# Patient Record
Sex: Male | Born: 1980 | Race: Black or African American | Hispanic: No | Marital: Single | Smoking: Never smoker
Health system: Southern US, Community
[De-identification: ages and names within clinical notes are randomized; demographics above are authoritative.]

## PROBLEM LIST (undated history)

## (undated) DIAGNOSIS — E7800 Pure hypercholesterolemia, unspecified: Secondary | ICD-10-CM

## (undated) DIAGNOSIS — K4090 Unilateral inguinal hernia, without obstruction or gangrene, not specified as recurrent: Secondary | ICD-10-CM

## (undated) DIAGNOSIS — T148XXA Other injury of unspecified body region, initial encounter: Secondary | ICD-10-CM

## (undated) HISTORY — PX: MULTIPLE TOOTH EXTRACTIONS: SHX2053

## (undated) HISTORY — DX: Pure hypercholesterolemia, unspecified: E78.00

---

## 2004-11-10 ENCOUNTER — Emergency Department (HOSPITAL_COMMUNITY): Admission: EM | Admit: 2004-11-10 | Discharge: 2004-11-10 | Payer: Self-pay | Admitting: Family Medicine

## 2005-01-25 ENCOUNTER — Emergency Department (HOSPITAL_COMMUNITY): Admission: EM | Admit: 2005-01-25 | Discharge: 2005-01-25 | Payer: Self-pay | Admitting: Family Medicine

## 2005-02-19 ENCOUNTER — Emergency Department (HOSPITAL_COMMUNITY): Admission: EM | Admit: 2005-02-19 | Discharge: 2005-02-19 | Payer: Self-pay | Admitting: Family Medicine

## 2005-09-01 ENCOUNTER — Emergency Department (HOSPITAL_COMMUNITY): Admission: EM | Admit: 2005-09-01 | Discharge: 2005-09-01 | Payer: Self-pay | Admitting: Emergency Medicine

## 2005-09-03 ENCOUNTER — Emergency Department (HOSPITAL_COMMUNITY): Admission: EM | Admit: 2005-09-03 | Discharge: 2005-09-03 | Payer: Self-pay | Admitting: Emergency Medicine

## 2006-04-20 ENCOUNTER — Emergency Department (HOSPITAL_COMMUNITY): Admission: EM | Admit: 2006-04-20 | Discharge: 2006-04-20 | Payer: Self-pay | Admitting: *Deleted

## 2007-07-06 ENCOUNTER — Emergency Department (HOSPITAL_COMMUNITY): Admission: EM | Admit: 2007-07-06 | Discharge: 2007-07-06 | Payer: Self-pay | Admitting: Emergency Medicine

## 2008-02-04 ENCOUNTER — Emergency Department (HOSPITAL_COMMUNITY): Admission: EM | Admit: 2008-02-04 | Discharge: 2008-02-05 | Payer: Self-pay | Admitting: Emergency Medicine

## 2008-09-18 ENCOUNTER — Emergency Department (HOSPITAL_COMMUNITY): Admission: EM | Admit: 2008-09-18 | Discharge: 2008-09-18 | Payer: Self-pay | Admitting: Emergency Medicine

## 2009-11-11 ENCOUNTER — Emergency Department (HOSPITAL_COMMUNITY): Admission: EM | Admit: 2009-11-11 | Discharge: 2009-11-11 | Payer: Self-pay | Admitting: Emergency Medicine

## 2011-09-13 LAB — DIFFERENTIAL
Basophils Absolute: 0
Basophils Relative: 0
Eosinophils Absolute: 0.2
Eosinophils Relative: 5
Lymphocytes Relative: 32
Lymphs Abs: 1.1
Monocytes Absolute: 0.2
Monocytes Relative: 5
Neutro Abs: 2.1
Neutrophils Relative %: 58

## 2011-09-13 LAB — URINALYSIS, ROUTINE W REFLEX MICROSCOPIC
Bilirubin Urine: NEGATIVE
Glucose, UA: NEGATIVE
Hgb urine dipstick: NEGATIVE
Ketones, ur: NEGATIVE
Nitrite: NEGATIVE
Protein, ur: NEGATIVE
Specific Gravity, Urine: 1.013
Urobilinogen, UA: 1
pH: 7.5

## 2011-09-13 LAB — BASIC METABOLIC PANEL
BUN: 9
CO2: 26
Calcium: 8.9
Chloride: 108
Creatinine, Ser: 1.11
GFR calc Af Amer: >60
GFR calc non Af Amer: >60
Glucose, Bld: 92
Potassium: 3.9
Sodium: 136

## 2011-09-13 LAB — CBC
HCT: 41.1
Hemoglobin: 14
MCHC: 34
MCV: 84.2
Platelets: 252
RBC: 4.88
RDW: 12.7
WBC: 3.6 — ABNORMAL LOW

## 2013-04-11 ENCOUNTER — Emergency Department (HOSPITAL_COMMUNITY)
Admission: EM | Admit: 2013-04-11 | Discharge: 2013-04-11 | Disposition: A | Discharge status: Home / Self Care | Payer: PRIVATE HEALTH INSURANCE | Attending: Emergency Medicine | Admitting: Emergency Medicine

## 2013-04-11 ENCOUNTER — Encounter (HOSPITAL_COMMUNITY): Payer: Self-pay | Admitting: Emergency Medicine

## 2013-04-11 DIAGNOSIS — R21 Rash and other nonspecific skin eruption: Secondary | ICD-10-CM | POA: Insufficient documentation

## 2013-04-11 MED ORDER — DIPHENHYDRAMINE HCL 25 MG PO CAPS: 25.0000 mg | ORAL_CAPSULE | Freq: Four times a day (QID) | ORAL | Status: DC | PRN

## 2013-04-11 MED ORDER — PERMETHRIN 5 % EX CREA: TOPICAL_CREAM | CUTANEOUS | Status: DC

## 2013-04-11 NOTE — ED Provider Notes (Signed)
Medical screening examination/treatment/procedure(s) were performed by non-physician practitioner and as supervising physician I was immediately available for consultation/collaboration. Devoria Albe, MD, Armando Gang   Ward Givens, MD 04/11/13 [phone]

## 2013-04-11 NOTE — ED Notes (Signed)
Pt states that he has an itchy rash on his arm since Sunday.  States that he "saw his psych nurse" and she gave him some medicine but it isn't going away.

## 2013-04-11 NOTE — ED Provider Notes (Signed)
History     CSN: [id number]  Arrival date & time 04/11/13  5621   First MD Initiated Contact with Patient 04/11/13 1058      Chief Complaint  Patient presents with  . Rash    (Consider location/radiation/quality/duration/timing/severity/associated sxs/prior treatment) HPI  32 year old male presents for evaluations of itchy rash. Patient reports he slept at a hotel week ago. For the past 3-4 days he has notice itchy rash throughout his body. Rash initially started on his forearm and now spread to his back, legs, and hands. He was getting triamcinolone by a psych nurse, which she has been using without any relief. He denies fever, chills, throat swelling, nausea, vomiting, diarrhea, abdominal pain. No recent medication change, new pets, soap or detergent change. No other family member with the same rash. He has been taking Benadryl for itch which has helped.   History reviewed. No pertinent past medical history.  History reviewed. No pertinent past surgical history.  History reviewed. No pertinent family history.  History  Substance Use Topics  . Smoking status: Never Smoker   . Smokeless tobacco: Not on file  . Alcohol Use: No      Review of Systems  Constitutional: Negative for fever.  Skin: Negative for rash.  Neurological: Negative for headaches.    Allergies  Strawberry  Home Medications   Current Outpatient Rx  Name  Route  Sig  Dispense  Refill  . triamcinolone cream (KENALOG) 0.5 %   Topical   Apply topically 2 (two) times daily as needed.           BP 112/71  Pulse 77  Temp(Src) 98.2 F (36.8 C) (Oral)  Resp 16  SpO2 98%  Physical Exam  Nursing note and vitals reviewed. Constitutional: He appears well-developed and well-nourished. No distress.  HENT:  Mouth/Throat: Oropharynx is clear and moist.  Eyes: Conjunctivae are normal.  Neck: Neck supple.  Cardiovascular: Normal rate and regular rhythm.   Pulmonary/Chest: Effort normal. He has no  wheezes.  Abdominal: Soft. There is no tenderness.  Neurological: He is alert.  Skin: Rash (Multiple small raised bump noted to nec, back, abdomen, R hand, and legs in no specific distribution.  Non petechial/vesicular/pustular lesions.) noted.    ED Course  Procedures (including critical care time)  11:23 AM Pt with rash suggestive of either bed bugs or scabies.  No red flags.  VSS, afebrile.  Will give treatment for scabies, and will continue with benadryl for itch.  Pt recommend to wash all clothing in hot water.    Labs Reviewed - No data to display No results found.   1. Rash       MDM  BP 112/71  Pulse 77  Temp(Src) 98.2 F (36.8 C) (Oral)  Resp 16  SpO2 98%         Fayrene Helper, PA-C 04/11/13 1126

## 2015-06-05 ENCOUNTER — Emergency Department (HOSPITAL_BASED_OUTPATIENT_CLINIC_OR_DEPARTMENT_OTHER)
Admission: EM | Admit: 2015-06-05 | Discharge: 2015-06-06 | Disposition: A | Discharge status: Home / Self Care | Payer: PRIVATE HEALTH INSURANCE | Attending: Emergency Medicine | Admitting: Emergency Medicine

## 2015-06-05 ENCOUNTER — Encounter (HOSPITAL_BASED_OUTPATIENT_CLINIC_OR_DEPARTMENT_OTHER): Payer: Self-pay

## 2015-06-05 DIAGNOSIS — K529 Noninfective gastroenteritis and colitis, unspecified: Secondary | ICD-10-CM | POA: Insufficient documentation

## 2015-06-05 HISTORY — DX: Unilateral inguinal hernia, without obstruction or gangrene, not specified as recurrent: K40.90

## 2015-06-05 NOTE — ED Notes (Signed)
Pt reports 4 days of diarrhea, retching.  Son has been sick with same.  Unknown if febrile.

## 2015-06-06 LAB — URINALYSIS, ROUTINE W REFLEX MICROSCOPIC
Bilirubin Urine: NEGATIVE
Glucose, UA: NEGATIVE mg/dL
Hgb urine dipstick: NEGATIVE
Ketones, ur: NEGATIVE mg/dL
Leukocytes, UA: NEGATIVE
Nitrite: NEGATIVE
PROTEIN: NEGATIVE mg/dL
Specific Gravity, Urine: 1.005 (ref 1.005–1.030)
Urobilinogen, UA: 0.2 mg/dL (ref 0.0–1.0)
pH: 5.5 (ref 5.0–8.0)

## 2015-06-06 MED ORDER — ONDANSETRON 4 MG PO TBDP
4.0000 mg | ORAL_TABLET | Freq: Once | ORAL | Status: AC
  Administered 2015-06-06: 4 mg via ORAL
  Filled 2015-06-06: qty 1

## 2015-06-06 MED ORDER — LOPERAMIDE HCL 2 MG PO CAPS: 2.0000 mg | ORAL_CAPSULE | ORAL | Status: DC | PRN

## 2015-06-06 MED ORDER — ONDANSETRON 4 MG PO TBDP: 4.0000 mg | ORAL_TABLET | Freq: Three times a day (TID) | ORAL | Status: DC | PRN

## 2015-06-06 MED ORDER — LOPERAMIDE HCL 2 MG PO CAPS
4.0000 mg | ORAL_CAPSULE | Freq: Once | ORAL | Status: AC
  Administered 2015-06-06: 4 mg via ORAL
  Filled 2015-06-06: qty 2

## 2015-06-06 NOTE — ED Provider Notes (Signed)
CSN: [id number]     Arrival date & time 06/05/15  2235 History   First MD Initiated Contact with Patient 06/06/15 0002     Chief Complaint  Patient presents with  . Diarrhea     (Consider location/radiation/quality/duration/timing/severity/associated sxs/prior Treatment) HPI  This is a 34 year old male who presents with vomiting and diarrhea 3 days. Patient reports that he went and picked up his son on Monday. Since that time he has had intermittent vomiting and diarrhea. No blood noted in his vomit or diarrhea. He has taken Pepto-Bismol without relief. He states he was unable to go to work on Tuesday because of his symptoms but has since been able to go to work. However, his symptoms recurred earlier this evening. He denies any abdominal pain. His son had similar symptoms.  Past Medical History  Diagnosis Date  . Inguinal hernia    History reviewed. No pertinent past surgical history. No family history on file. History  Substance Use Topics  . Smoking status: Never Smoker   . Smokeless tobacco: Not on file  . Alcohol Use: Yes     Comment: occ    Review of Systems  Constitutional: Negative.  Negative for fever.  Respiratory: Negative.  Negative for chest tightness and shortness of breath.   Cardiovascular: Negative.  Negative for chest pain.  Gastrointestinal: Positive for nausea, vomiting and diarrhea. Negative for abdominal pain.  Genitourinary: Negative.  Negative for dysuria.  Neurological: Negative for headaches.  All other systems reviewed and are negative.     Allergies  Strawberry  Home Medications   Prior to Admission medications   Medication Sig Start Date End Date Taking? Authorizing Provider  diphenhydrAMINE (BENADRYL) 25 mg capsule Take 1 capsule (25 mg total) by mouth every 6 (six) hours as needed for itching. 04/11/13   Fayrene Helper, PA-C  loperamide (IMODIUM) 2 MG capsule Take 1 capsule (2 mg total) by mouth as needed for diarrhea or loose stools. 06/06/15    Shon Baton, MD  ondansetron (ZOFRAN-ODT) 4 MG disintegrating tablet Take 1 tablet (4 mg total) by mouth every 8 (eight) hours as needed for nausea or vomiting. 06/06/15   Shon Baton, MD  permethrin (ELIMITE) 5 % cream Apply from neck down to the rest of your body, leave it for 8 hrs and wash it off.  Do it once, but if rash persist then apply again in 1 week. 04/11/13   Fayrene Helper, PA-C  triamcinolone cream (KENALOG) 0.5 % Apply topically 2 (two) times daily as needed.    Historical Provider, MD   BP 109/73 mmHg  Pulse 77  Temp(Src) 98.3 F (36.8 C) (Oral)  Resp 18  Ht  (1.676 m)  Wt 155 lb (70.308 kg)  BMI 25.03 kg/m2  SpO2 99% Physical Exam  Constitutional: He is oriented to person, place, and time. He appears well-developed and well-nourished. No distress.  HENT:  Head: Normocephalic and atraumatic.  Cardiovascular: Normal rate, regular rhythm and normal heart sounds.   No murmur heard. Pulmonary/Chest: Effort normal and breath sounds normal. No respiratory distress. He has no wheezes.  Abdominal: Soft. Bowel sounds are normal. There is no tenderness. There is no rebound and no guarding.  Musculoskeletal: He exhibits no edema.  Neurological: He is alert and oriented to person, place, and time.  Skin: Skin is warm and dry.  Psychiatric: He has a normal mood and affect.  Nursing note and vitals reviewed.   ED Course  Procedures (including critical care time)  Labs Review Labs Reviewed  URINALYSIS, ROUTINE W REFLEX MICROSCOPIC (NOT [facility])    Imaging Review No results found.   EKG Interpretation None      MDM   Final diagnoses:  Gastroenteritis    Patient presents with vomiting and diarrhea 3 days. Nontoxic on exam. Appears well-hydrated. Mucous membranes are moist. Does not have any abdominal pain and is nontender on exam. Suspect gastroenteritis given the son had similar symptoms. Patient was given Imodium and Zofran. No active emesis while  emergency department. Urinalysis without evidence of ketones. Discuss with patient supportive care including Imodium and Zofran at home. Patient stated understanding.  After history, exam, and medical workup I feel the patient has been appropriately medically screened and is safe for discharge home. Pertinent diagnoses were discussed with the patient. Patient was given return precautions.     Shon Batonourtney F Horton, MD 06/06/15 [phone]

## 2015-06-06 NOTE — Discharge Instructions (Signed)

## 2018-04-23 ENCOUNTER — Encounter (HOSPITAL_COMMUNITY): Payer: Self-pay | Admitting: Emergency Medicine

## 2018-04-23 ENCOUNTER — Other Ambulatory Visit: Payer: Self-pay

## 2018-04-23 ENCOUNTER — Emergency Department (HOSPITAL_COMMUNITY): Payer: Medicaid Other

## 2018-04-23 ENCOUNTER — Emergency Department (HOSPITAL_COMMUNITY)
Admission: EM | Admit: 2018-04-23 | Discharge: 2018-04-23 | Disposition: A | Discharge status: Home / Self Care | Payer: Medicaid Other | Attending: Emergency Medicine | Admitting: Emergency Medicine

## 2018-04-23 DIAGNOSIS — Y929 Unspecified place or not applicable: Secondary | ICD-10-CM | POA: Diagnosis not present

## 2018-04-23 DIAGNOSIS — S82841A Displaced bimalleolar fracture of right lower leg, initial encounter for closed fracture: Secondary | ICD-10-CM | POA: Insufficient documentation

## 2018-04-23 DIAGNOSIS — S99911A Unspecified injury of right ankle, initial encounter: Secondary | ICD-10-CM | POA: Diagnosis present

## 2018-04-23 DIAGNOSIS — W010XXA Fall on same level from slipping, tripping and stumbling without subsequent striking against object, initial encounter: Secondary | ICD-10-CM | POA: Insufficient documentation

## 2018-04-23 DIAGNOSIS — Y9302 Activity, running: Secondary | ICD-10-CM | POA: Diagnosis not present

## 2018-04-23 DIAGNOSIS — Y999 Unspecified external cause status: Secondary | ICD-10-CM | POA: Diagnosis not present

## 2018-04-23 MED ORDER — OXYCODONE-ACETAMINOPHEN 5-325 MG PO TABS
1.0000 | ORAL_TABLET | Freq: Once | ORAL | Status: AC
  Administered 2018-04-23: 1 via ORAL
  Filled 2018-04-23: qty 1

## 2018-04-23 MED ORDER — OXYCODONE-ACETAMINOPHEN 5-325 MG PO TABS: 1.0000 | ORAL_TABLET | ORAL | 0 refills | Status: DC | PRN

## 2018-04-23 NOTE — Consult Note (Signed)
[facility] Consultation  Reason for Consult: Right ankle pain Referring Physician: Dione Booze, MD  Lance Collins is an 37 y.o. male.  HPI: Patient reports running and tripping with inability to bear weight thereafter. Denies other injury. NO tingling or numbness.   Past Medical History:  Diagnosis Date  . Inguinal hernia     History reviewed. No pertinent surgical history.  History reviewed. No pertinent family history.  Social History:  reports that he has never smoked. He has never used smokeless tobacco. He reports that he drinks alcohol. He may smoke materials other than tobacco.  Allergies:  Allergies  Allergen Reactions  . Strawberry Extract Hives    Medications: Prior to Admission:  (Not in a hospital admission)  No results found for this or any previous visit (from the past 48 hour(s)).  Dg Ankle Complete Right  Result Date: 04/23/2018 CLINICAL DATA:  Fall with ankle pain. EXAM: RIGHT ANKLE - COMPLETE 3+ VIEW COMPARISON:  None. FINDINGS: There are comminuted, minimally displaced fractures of the medial and lateral malleolus with severe circumferential soft tissue swelling. IMPRESSION: Comminuted, minimally displaced fractures of the medial and lateral malleoli of the right ankle with severe soft tissue swelling. Electronically Signed   By: Deatra Robinson M.D.   On: 04/23/2018 01:21    ROS No recent fever, bleeding abnormalities, urologic dysfunction, GI problems, or weight gain.  Blood pressure (!) 147/103, pulse 98, temperature 98 F (36.7 C), temperature source Oral, resp. rate 18, height  (1.702 m), weight 70.3 kg (155 lb), SpO2 99 %. Physical Exam  Agitated. Smells of alcohol. NCAT RRR No audible wheezing or visible retractions RLE No traumatic wounds, ecchymosis, or rash  Quite tender medially and laterally  No knee or ankle effusion  Sens DPN, SPN, TN intact  Motor EHL, ext, flex, evers 5/5  DP 2+, Localized swelling at the ankle  joint and fracture  Assessment/Plan: Displaced and comminuted right bimall ankle fracture  Level of swelling precludes early intervention Ice, elevate, Splint and f/u this week for reassessment of soft tissues NWB RLE Percocet  Appreciate Dr. Reynolds Bowl care  Myrene Galas, MD [facility], [facility] [phone]

## 2018-04-23 NOTE — ED Triage Notes (Signed)
Pt reports tripping and landing on right ankle appx ago and swelling noted to lateral right ankle.

## 2018-04-23 NOTE — ED Provider Notes (Signed)
[facility]-EMERGENCY DEPT Provider Note   CSN: [id number] Arrival date & time: 04/23/18  0016     History   Chief Complaint Chief Complaint  Patient presents with  . Ankle Injury    HPI Lance Collins is a 37 y.o. male.  The history is provided by the patient.  He was running and tripped and fell injuring his right ankle.  He is complaining of severe pain in that ankle and is unable to bear weight.  He denies other injury.  Past Medical History:  Diagnosis Date  . Inguinal hernia     There are no active problems to display for this patient.   History reviewed. No pertinent surgical history.      Home Medications    Prior to Admission medications   Medication Sig Start Date End Date Taking? Authorizing Provider  diphenhydrAMINE (BENADRYL) 25 mg capsule Take 1 capsule (25 mg total) by mouth every 6 (six) hours as needed for itching. 04/11/13   Fayrene Helper, PA-C  loperamide (IMODIUM) 2 MG capsule Take 1 capsule (2 mg total) by mouth as needed for diarrhea or loose stools. 06/06/15   Horton, Mayer Masker, MD  ondansetron (ZOFRAN-ODT) 4 MG disintegrating tablet Take 1 tablet (4 mg total) by mouth every 8 (eight) hours as needed for nausea or vomiting. 06/06/15   Horton, Mayer Masker, MD  permethrin (ELIMITE) 5 % cream Apply from neck down to the rest of your body, leave it for 8 hrs and wash it off.  Do it once, but if rash persist then apply again in 1 week. 04/11/13   Fayrene Helper, PA-C  triamcinolone cream (KENALOG) 0.5 % Apply topically 2 (two) times daily as needed.    [provider]    Family History History reviewed. No pertinent family history.  Social History Social History   Tobacco Use  . Smoking status: Never Smoker  . Smokeless tobacco: Never Used  Substance Use Topics  . Alcohol use: Yes    Comment: occ  . Drug use: No     Allergies   Strawberry extract   Review of Systems Review of Systems  All other systems reviewed  and are negative.    Physical Exam Updated Vital Signs BP 118/81 (BP Location: Left Arm)   Pulse (!) 103   Temp 98 F (36.7 C) (Oral)   Resp 18   Ht  (1.702 m)   Wt 70.3 kg (155 lb)   SpO2 97%   BMI 24.28 kg/m   Physical Exam  Nursing note and vitals reviewed.  37 year old male, resting comfortably and in no acute distress. Vital signs are significant for borderline elevated heart rate. Oxygen saturation is 97%, which is normal. Head is normocephalic and atraumatic. PERRLA, EOMI. Oropharynx is clear. Neck is nontender and supple without adenopathy or JVD. Back is nontender and there is no CVA tenderness. Lungs are clear without rales, wheezes, or rhonchi. Chest is nontender. Heart has regular rate and rhythm without murmur. Abdomen is soft, flat, nontender without masses or hepatosplenomegaly and peristalsis is normoactive. Extremities: Swelling of the right ankle with tenderness rather diffusely.  No instability.  Distal neurovascular exam is intact with strong dorsalis pedis pulses, prompt capillary refill, normal sensation. Skin is warm and dry without rash. Neurologic: Mental status is normal, cranial nerves are intact, there are no motor or sensory deficits.  ED Treatments / Results   Radiology Dg Ankle Complete Right  Result Date: 04/23/2018 CLINICAL DATA:  Fall with ankle pain. EXAM: RIGHT ANKLE - COMPLETE 3+ VIEW COMPARISON:  None. FINDINGS: There are comminuted, minimally displaced fractures of the medial and lateral malleolus with severe circumferential soft tissue swelling. IMPRESSION: Comminuted, minimally displaced fractures of the medial and lateral malleoli of the right ankle with severe soft tissue swelling. Electronically Signed   By: Deatra Robinson M.D.   On: 04/23/2018 01:21    Procedures .Splint Application Date/Time: 04/23/2018 3:10 AM Performed by: Dione Booze, MD Authorized by: Dione Booze, MD   Consent:    Consent obtained:  Verbal    Consent given by:  Patient   Risks discussed:  Discoloration, numbness, pain and swelling   Alternatives discussed:  No treatment Pre-procedure details:    Sensation:  Normal   Skin color:  Normal Procedure details:    Laterality:  Right   Location:  Leg   Leg:  L lower leg   Strapping: no     Splint type:  Ankle stirrup   Supplies:  Elastic bandage, Ortho-Glass and cotton padding Post-procedure details:    Pain:  Improved   Sensation:  Normal   Skin color:  Normal   Patient tolerance of procedure:  Tolerated well, no immediate complications Comments:     Splint applied by orthopedic technician, neurovascular status reevaluated by me following splint application.   Medications Ordered in ED Medications  oxyCODONE-acetaminophen (PERCOCET/ROXICET) 5-325 MG per tablet 1 tablet (has no administration in time range)     Initial Impression / Assessment and Plan / ED Course  I have reviewed the triage vital signs and the nursing notes.  Pertinent labs & imaging results that were available during my care of the patient were reviewed by me and considered in my medical decision making (see chart for details).  Bimalleolar fracture of the right ankle.  He is placed in a stirrup splint and will need to follow-up with orthopedics.  Dr. Marcello Fennel, who is on-call for orthopedics, and has reviewed the x-rays and states she will arrange for surgery early next week.  He will be discharged with prescription for oxycodone-acetaminophen, and given crutches.  Final Clinical Impressions(s) / ED Diagnoses   Final diagnoses:  Fall from slip, trip, or stumble, initial encounter  Closed bimalleolar fracture of right ankle, initial encounter    ED Discharge Orders        Ordered    oxyCODONE-acetaminophen (PERCOCET) 5-325 MG tablet  Every 4 hours PRN     04/23/18 0409       Dione Booze, MD 04/23/18 [phone]

## 2018-04-23 NOTE — Discharge Instructions (Signed)
Apply ice several times a day.   Keep your foot elevated as much as possible.   No weight bearing until cleared by the orthopedic doctor.

## 2018-05-10 ENCOUNTER — Encounter (HOSPITAL_COMMUNITY): Payer: Self-pay | Admitting: *Deleted

## 2018-05-10 ENCOUNTER — Other Ambulatory Visit: Payer: Self-pay

## 2018-05-10 NOTE — Progress Notes (Signed)
Pt denies SOB, chest pain, and being under the care of a cardiologist. Pt denies having a stress test, echo and cardiac cath. Pt denies having an EKG and chest x ray within the last year. Pt denies recent labs. Pt made aware to stop taking vitamins, fish oil and herbal medications. Do not take any NSAIDs ie: Ibuprofen, Advil, Naproxen (Aleve), Motrin, BC and Goody Powder. Pt verbalized understanding of all pre-op instructions. 

## 2018-05-11 ENCOUNTER — Ambulatory Visit (HOSPITAL_COMMUNITY): Payer: Medicaid Other | Admitting: Certified Registered Nurse Anesthetist

## 2018-05-11 ENCOUNTER — Encounter (HOSPITAL_COMMUNITY): Payer: Self-pay | Admitting: *Deleted

## 2018-05-11 ENCOUNTER — Ambulatory Visit (HOSPITAL_COMMUNITY): Payer: Medicaid Other

## 2018-05-11 ENCOUNTER — Ambulatory Visit (HOSPITAL_COMMUNITY)
Admission: RE | Admit: 2018-05-11 | Discharge: 2018-05-11 | Disposition: A | Discharge status: Home / Self Care | Payer: Medicaid Other | Source: Ambulatory Visit | Attending: Orthopedic Surgery | Admitting: Orthopedic Surgery

## 2018-05-11 ENCOUNTER — Encounter (HOSPITAL_COMMUNITY)
Admission: RE | Disposition: A | Discharge status: Home / Self Care | Payer: Self-pay | Source: Ambulatory Visit | Attending: Orthopedic Surgery

## 2018-05-11 DIAGNOSIS — W1830XA Fall on same level, unspecified, initial encounter: Secondary | ICD-10-CM | POA: Insufficient documentation

## 2018-05-11 DIAGNOSIS — S93431A Sprain of tibiofibular ligament of right ankle, initial encounter: Secondary | ICD-10-CM | POA: Insufficient documentation

## 2018-05-11 DIAGNOSIS — S82841A Displaced bimalleolar fracture of right lower leg, initial encounter for closed fracture: Secondary | ICD-10-CM | POA: Diagnosis not present

## 2018-05-11 DIAGNOSIS — S82851A Displaced trimalleolar fracture of right lower leg, initial encounter for closed fracture: Secondary | ICD-10-CM | POA: Diagnosis present

## 2018-05-11 DIAGNOSIS — T148XXA Other injury of unspecified body region, initial encounter: Secondary | ICD-10-CM

## 2018-05-11 DIAGNOSIS — Z419 Encounter for procedure for purposes other than remedying health state, unspecified: Secondary | ICD-10-CM

## 2018-05-11 HISTORY — DX: Other injury of unspecified body region, initial encounter: T14.8XXA

## 2018-05-11 HISTORY — PX: ORIF ANKLE FRACTURE: SHX5408

## 2018-05-11 LAB — HEMOGLOBIN: Hemoglobin: 13 g/dL (ref 13.0–17.0)

## 2018-05-11 SURGERY — OPEN REDUCTION INTERNAL FIXATION (ORIF) ANKLE FRACTURE
Anesthesia: Regional | Site: Ankle | Laterality: Right

## 2018-05-11 MED ORDER — LACTATED RINGERS IV SOLN
INTRAVENOUS | Status: DC
  Administered 2018-05-11: 09:00:00 via INTRAVENOUS

## 2018-05-11 MED ORDER — ROPIVACAINE HCL 7.5 MG/ML IJ SOLN
INTRAMUSCULAR | Status: DC | PRN
  Administered 2018-05-11: 20 mL via PERINEURAL

## 2018-05-11 MED ORDER — ONDANSETRON HCL 4 MG/2ML IJ SOLN
INTRAMUSCULAR | Status: DC | PRN
  Administered 2018-05-11 (×2): 4 mg via INTRAVENOUS

## 2018-05-11 MED ORDER — ACETAMINOPHEN 500 MG PO TABS
1000.0000 mg | ORAL_TABLET | Freq: Once | ORAL | Status: AC
  Administered 2018-05-11: 1000 mg via ORAL

## 2018-05-11 MED ORDER — PROPOFOL 10 MG/ML IV BOLUS
INTRAVENOUS | Status: DC | PRN
  Administered 2018-05-11: 30 mg via INTRAVENOUS
  Administered 2018-05-11: 130 mg via INTRAVENOUS

## 2018-05-11 MED ORDER — FENTANYL CITRATE (PF) 250 MCG/5ML IJ SOLN
INTRAMUSCULAR | Status: AC
  Filled 2018-05-11: qty 5

## 2018-05-11 MED ORDER — CEFAZOLIN SODIUM-DEXTROSE 2-4 GM/100ML-% IV SOLN
2.0000 g | INTRAVENOUS | Status: AC
  Administered 2018-05-11: 2 g via INTRAVENOUS

## 2018-05-11 MED ORDER — LIDOCAINE 2% (20 MG/ML) 5 ML SYRINGE
INTRAMUSCULAR | Status: AC
  Filled 2018-05-11: qty 5

## 2018-05-11 MED ORDER — DEXAMETHASONE SODIUM PHOSPHATE 10 MG/ML IJ SOLN
INTRAMUSCULAR | Status: AC
  Filled 2018-05-11: qty 1

## 2018-05-11 MED ORDER — CEFAZOLIN SODIUM-DEXTROSE 2-4 GM/100ML-% IV SOLN
INTRAVENOUS | Status: AC
  Filled 2018-05-11: qty 100

## 2018-05-11 MED ORDER — GABAPENTIN 300 MG PO CAPS
ORAL_CAPSULE | ORAL | Status: AC
  Administered 2018-05-11: 300 mg via ORAL
  Filled 2018-05-11: qty 1

## 2018-05-11 MED ORDER — ONDANSETRON HCL 4 MG/2ML IJ SOLN
INTRAMUSCULAR | Status: AC
  Filled 2018-05-11: qty 2

## 2018-05-11 MED ORDER — MIDAZOLAM HCL 2 MG/2ML IJ SOLN
INTRAMUSCULAR | Status: AC
  Administered 2018-05-11: 2 mg via INTRAVENOUS
  Filled 2018-05-11: qty 2

## 2018-05-11 MED ORDER — BUPIVACAINE-EPINEPHRINE (PF) 0.5% -1:200000 IJ SOLN
INTRAMUSCULAR | Status: DC | PRN
  Administered 2018-05-11: 30 mL via PERINEURAL

## 2018-05-11 MED ORDER — ACETAMINOPHEN 160 MG/5ML PO SOLN: 325.0000 mg | ORAL | Status: DC | PRN

## 2018-05-11 MED ORDER — ROCURONIUM BROMIDE 10 MG/ML (PF) SYRINGE
PREFILLED_SYRINGE | INTRAVENOUS | Status: AC
  Filled 2018-05-11: qty 5

## 2018-05-11 MED ORDER — OXYCODONE-ACETAMINOPHEN 5-325 MG PO TABS: 1.0000 | ORAL_TABLET | Freq: Four times a day (QID) | ORAL | 0 refills | Status: DC | PRN

## 2018-05-11 MED ORDER — CHLORHEXIDINE GLUCONATE 4 % EX LIQD: 60.0000 mL | Freq: Once | CUTANEOUS | Status: DC

## 2018-05-11 MED ORDER — FENTANYL CITRATE (PF) 100 MCG/2ML IJ SOLN
INTRAMUSCULAR | Status: AC
  Administered 2018-05-11: 100 ug via INTRAVENOUS
  Filled 2018-05-11: qty 2

## 2018-05-11 MED ORDER — BUPIVACAINE HCL (PF) 0.5 % IJ SOLN: INTRAMUSCULAR | Status: DC | PRN

## 2018-05-11 MED ORDER — OXYCODONE HCL 5 MG PO TABS
5.0000 mg | ORAL_TABLET | Freq: Four times a day (QID) | ORAL | 0 refills | Status: DC | PRN
Start: 2018-05-11 — End: 2020-09-26

## 2018-05-11 MED ORDER — 0.9 % SODIUM CHLORIDE (POUR BTL) OPTIME
TOPICAL | Status: DC | PRN
  Administered 2018-05-11: 1000 mL

## 2018-05-11 MED ORDER — FENTANYL CITRATE (PF) 100 MCG/2ML IJ SOLN
100.0000 ug | Freq: Once | INTRAMUSCULAR | Status: AC
  Administered 2018-05-11: 100 ug via INTRAVENOUS

## 2018-05-11 MED ORDER — DEXAMETHASONE SODIUM PHOSPHATE 10 MG/ML IJ SOLN
INTRAMUSCULAR | Status: DC | PRN
  Administered 2018-05-11: 10 mg via INTRAVENOUS

## 2018-05-11 MED ORDER — GABAPENTIN 300 MG PO CAPS
300.0000 mg | ORAL_CAPSULE | Freq: Once | ORAL | Status: AC
  Administered 2018-05-11: 300 mg via ORAL

## 2018-05-11 MED ORDER — OXYCODONE HCL 5 MG/5ML PO SOLN: 5.0000 mg | Freq: Once | ORAL | Status: DC | PRN

## 2018-05-11 MED ORDER — FENTANYL CITRATE (PF) 100 MCG/2ML IJ SOLN: 25.0000 ug | INTRAMUSCULAR | Status: DC | PRN

## 2018-05-11 MED ORDER — OXYCODONE HCL 5 MG PO TABS: 5.0000 mg | ORAL_TABLET | Freq: Once | ORAL | Status: DC | PRN

## 2018-05-11 MED ORDER — MIDAZOLAM HCL 2 MG/2ML IJ SOLN
2.0000 mg | Freq: Once | INTRAMUSCULAR | Status: AC
  Administered 2018-05-11: 2 mg via INTRAVENOUS

## 2018-05-11 MED ORDER — ASPIRIN EC 325 MG PO TBEC: 325.0000 mg | DELAYED_RELEASE_TABLET | Freq: Every day | ORAL | 0 refills | Status: DC

## 2018-05-11 MED ORDER — ACETAMINOPHEN 500 MG PO TABS: 500.0000 mg | ORAL_TABLET | Freq: Two times a day (BID) | ORAL | 0 refills | Status: DC

## 2018-05-11 MED ORDER — LIDOCAINE 2% (20 MG/ML) 5 ML SYRINGE
INTRAMUSCULAR | Status: DC | PRN
  Administered 2018-05-11: 60 mg via INTRAVENOUS

## 2018-05-11 MED ORDER — ACETAMINOPHEN 500 MG PO TABS
ORAL_TABLET | ORAL | Status: AC
  Administered 2018-05-11: 1000 mg via ORAL
  Filled 2018-05-11: qty 2

## 2018-05-11 MED ORDER — PROPOFOL 10 MG/ML IV BOLUS
INTRAVENOUS | Status: AC
  Filled 2018-05-11: qty 20

## 2018-05-11 MED ORDER — ACETAMINOPHEN 325 MG PO TABS: 325.0000 mg | ORAL_TABLET | ORAL | Status: DC | PRN

## 2018-05-11 MED ORDER — BUPIVACAINE LIPOSOME 1.3 % IJ SUSP
INTRAMUSCULAR | Status: DC | PRN
Start: 2018-05-11 — End: 2018-05-11

## 2018-05-11 SURGICAL SUPPLY — 73 items
BANDAGE ACE 4X5 VEL STRL LF (GAUZE/BANDAGES/DRESSINGS) ×3 IMPLANT
BANDAGE ACE 6X5 VEL STRL LF (GAUZE/BANDAGES/DRESSINGS) ×3 IMPLANT
BANDAGE ESMARK 6X9 LF (GAUZE/BANDAGES/DRESSINGS) ×1 IMPLANT
BIT DRILL 2.4X140 LONG SOLID (BIT) ×3 IMPLANT
BIT DRILL SOLID 2.0 X 110MM (DRILL) ×1 IMPLANT
BNDG ESMARK 6X9 LF (GAUZE/BANDAGES/DRESSINGS) ×3
BNDG GAUZE ELAST 4 BULKY (GAUZE/BANDAGES/DRESSINGS) ×3 IMPLANT
BRUSH SCRUB SURG 4.25 DISP (MISCELLANEOUS) ×6 IMPLANT
COVER MAYO STAND STRL (DRAPES) ×3 IMPLANT
COVER SURGICAL LIGHT HANDLE (MISCELLANEOUS) ×3 IMPLANT
DEVICE FIXATION SYNDESMOSIS (Bone Implant) ×3 IMPLANT
DRAPE C-ARM 42X72 X-RAY (DRAPES) IMPLANT
DRAPE C-ARMOR (DRAPES) ×6 IMPLANT
DRAPE HALF SHEET 40X57 (DRAPES) ×3 IMPLANT
DRAPE U-SHAPE 47X51 STRL (DRAPES) ×3 IMPLANT
DRILL SOLID 2.0 X 110MM (DRILL) ×3
DRSG ADAPTIC 3X8 NADH LF (GAUZE/BANDAGES/DRESSINGS) ×3 IMPLANT
DRSG EMULSION OIL 3X3 NADH (GAUZE/BANDAGES/DRESSINGS) IMPLANT
ELECT REM PT RETURN 9FT ADLT (ELECTROSURGICAL) ×3
ELECTRODE REM PT RTRN 9FT ADLT (ELECTROSURGICAL) ×1 IMPLANT
FIXATION ZIPTIGHT ANKLE SNDSMS (Ankle) ×1 IMPLANT
GAUZE SPONGE 4X4 12PLY STRL (GAUZE/BANDAGES/DRESSINGS) ×3 IMPLANT
GLOVE BIO SURGEON STRL SZ7.5 (GLOVE) ×3 IMPLANT
GLOVE BIO SURGEON STRL SZ8 (GLOVE) ×3 IMPLANT
GLOVE BIOGEL PI IND STRL 7.5 (GLOVE) ×1 IMPLANT
GLOVE BIOGEL PI IND STRL 8 (GLOVE) ×1 IMPLANT
GLOVE BIOGEL PI INDICATOR 7.5 (GLOVE) ×2
GLOVE BIOGEL PI INDICATOR 8 (GLOVE) ×2
GOWN STRL REUS W/ TWL LRG LVL3 (GOWN DISPOSABLE) ×2 IMPLANT
GOWN STRL REUS W/ TWL XL LVL3 (GOWN DISPOSABLE) ×1 IMPLANT
GOWN STRL REUS W/TWL LRG LVL3 (GOWN DISPOSABLE) ×4
GOWN STRL REUS W/TWL XL LVL3 (GOWN DISPOSABLE) ×2
K-WIRE SMOOTH TROCAR 2.0X150 (WIRE) ×3
KIT BASIN OR (CUSTOM PROCEDURE TRAY) ×3 IMPLANT
KIT TURNOVER KIT B (KITS) ×3 IMPLANT
KWIRE SMOOTH TROCAR 2.0X150 (WIRE) ×1 IMPLANT
MANIFOLD NEPTUNE II (INSTRUMENTS) ×3 IMPLANT
NEEDLE HYPO 21X1.5 SAFETY (NEEDLE) IMPLANT
NS IRRIG 1000ML POUR BTL (IV SOLUTION) ×3 IMPLANT
PACK GENERAL/GYN (CUSTOM PROCEDURE TRAY) ×3 IMPLANT
PACK ORTHO EXTREMITY (CUSTOM PROCEDURE TRAY) ×3 IMPLANT
PAD ABD 8X10 STRL (GAUZE/BANDAGES/DRESSINGS) ×3 IMPLANT
PAD ARMBOARD 7.5X6 YLW CONV (MISCELLANEOUS) ×6 IMPLANT
PAD CAST 4YDX4 CTTN HI CHSV (CAST SUPPLIES) ×1 IMPLANT
PADDING CAST COTTON 4X4 STRL (CAST SUPPLIES) ×2
PADDING CAST COTTON 6X4 STRL (CAST SUPPLIES) ×3 IMPLANT
PLATE FIBULA 13HOLE (Plate) ×3 IMPLANT
PLATE MEDIAL MALLEOLUS 4H HOOK (Plate) ×3 IMPLANT
SCREW 2.7X22 NONLOCK ×3 IMPLANT
SCREW LOCK PLATE R3 2.7X16 ×3 IMPLANT
SCREW LOCK PLATE R3 2.7X17 (Screw) ×9 IMPLANT
SCREW NON LOCKING 3.5X12 (Screw) ×6 IMPLANT
SCREW NON LOCKING 3.5X14 (Screw) ×3 IMPLANT
SCREW NON LOCKING 3.5X20 (Screw) ×3 IMPLANT
SCREW R3CON N/L PLATE 3.5X44 (Screw) ×3 IMPLANT
SPLINT PLASTER CAST XFAST 5X30 (CAST SUPPLIES) ×1 IMPLANT
SPLINT PLASTER XFAST SET 5X30 (CAST SUPPLIES) ×2
SPONGE LAP 18X18 X RAY DECT (DISPOSABLE) ×6 IMPLANT
STAPLER VISISTAT 35W (STAPLE) IMPLANT
SUCTION FRAZIER HANDLE 10FR (MISCELLANEOUS) ×2
SUCTION TUBE FRAZIER 10FR DISP (MISCELLANEOUS) ×1 IMPLANT
SUT ETHILON 3 0 PS 1 (SUTURE) ×9 IMPLANT
SUT PDS AB 2-0 CT1 27 (SUTURE) IMPLANT
SUT VIC AB 2-0 CT1 27 (SUTURE) ×4
SUT VIC AB 2-0 CT1 TAPERPNT 27 (SUTURE) ×2 IMPLANT
TOWEL OR 17X24 6PK STRL BLUE (TOWEL DISPOSABLE) ×3 IMPLANT
TOWEL OR 17X26 10 PK STRL BLUE (TOWEL DISPOSABLE) ×6 IMPLANT
TUBE CONNECTING 12'X1/4 (SUCTIONS) ×1
TUBE CONNECTING 12X1/4 (SUCTIONS) ×2 IMPLANT
UNDERPAD 30X30 (UNDERPADS AND DIAPERS) ×3 IMPLANT
WATER STERILE IRR 1000ML POUR (IV SOLUTION) ×3 IMPLANT
WIRE THREADED OLIVE 1.4 (WIRE) ×3 IMPLANT
ZIPTIGHT ANKLE SYNODESMOSS FIX (Ankle) ×3 IMPLANT

## 2018-05-11 NOTE — Op Note (Signed)
05/11/2018  1:36 PM  PATIENT:  Lance Collins  37 y.o. male  PRE-OPERATIVE DIAGNOSIS:  RIGHT TRIMALLEOLAR ANKLE FRACTURE AND SYNDESMOSIS DISRUPTION  POST-OPERATIVE DIAGNOSIS:  1. RIGHT BIMALLEOLAR ANKLE FRACTURE 2. RIGHT ANKLE SYNDESMOSIS DISRUPTION  PROCEDURE:  Procedure(s): 1. OPEN REDUCTION INTERNAL FIXATION (ORIF) RIGHT BIMALLEOLAR ANKLE FRACTURE 2. REPAIR RIGHT ANKLE SYNDESMOSIS INJURY (Right) 3. STRESS FLUOROSCOPY RIGHT ANKLE SYNDESMOSIS INJURY (Right)  SURGEON:  Surgeon(s) and Role:    Myrene Galas* Jamiaya Bina, MD - Primary  ASSISTANTS: Harlan Stainsiane Johnson, RN-FA   ANESTHESIA:   general  EBL:  20 mL   BLOOD ADMINISTERED:none  DRAINS: none   LOCAL MEDICATIONS USED:  NONE  SPECIMEN:  No Specimen  DISPOSITION OF SPECIMEN:  N/A  COUNTS:  YES  TOURNIQUET:  None  DICTATION: Written.  PLAN OF CARE: Discharge to home after PACU  PATIENT DISPOSITION:  PACU - hemodynamically stable.   Delay start of Pharmacological VTE agent (>24hrs) due to surgical blood loss or risk of bleeding: no  BRIEF SUMMARY AND INDICATIONS FOR PROCEDURE:  The patient is a 37 year old male who sustained a ground-level fall resulting in a fracture subluxation of the ankle, treated with reduction at the time of presentation to the Emergency Department, followed by splint application.  He has been seen in the office several times to reassess soft tissues, which now show sufficient swelling resolution to allow for surgical repair.  I discussed risks and benefits of surgery including the possibility of infection, DVT, PE, nerve injury, vessel injury, loss of motion, arthritis, symptomatic hardware, heart attack, stroke and need for further surgery, among others.  After full discussion, he did wish to proceed.  SUMMARY OF PROCEDURE:  The patient was taken to the operating room after administration of a regional block and preoperative antibiotics.  The left lower extremity was prepped and draped in the usual sterile  fashion.  A tourniquet was placed about the thigh but never inflated during the procedure.  A timeout was held, and then an incision was made directly over the lateral malleolus with careful dissection to avoid injury to the superficial peroneal nerve.  The periosteum was left intact as we  continued the dissection deep.  The fracture site was identified, and a curettage and lavage was used to remove hematoma.  With the assistance of distal manipulation and placement of tenaculums, we were able to obtain an anatomic reduction with  interdigitation of the fracture fragments.  Because of the angle and slight comminution of the fracture site, I was unable to place a lag screw but was able to hold this interdigitated during application of a lateral malleolus plate.  We used the Paragon  system and placed standard fixation in the shaft and distal lateral malleolus, confirming plate position with x-ray and then continuing with a standard fixation as well as a locked fixation.  Final images showed appropriate reductional replacement,  trajectory, and length.  Because of the patient's injury films which demonstrated widening and fracture fragments adjacent to the syndesmosis to suggest possible instability, a stress evaluation was performed, consisting of external rotation of the ankle while holding it in the mortise view. Under live flouro, instability was identified by lateral translation of the talus, widening of the syndesmotiic interval, and lateral translation of the medial malleolus fragment across he medial clear space. Consequently we proceeded with syndesmotic fixation using the [place] suture anchor. A curvilinear medial incision was made to allow for limited plating. I extended the incision to directly visualize the anchor as it  did not deploy properly. Partial saphenous vein injury was noted but no bleeding. The nerve was meticulously protected and the anchor placed directly to avoid an neurovascular  encroachment. This provided excellent fixation and restoration of the syndesmosis.  I continued with the medial side.  Because of excessive comminution and the three weeks from injury, I left the periosteum and callous intact but inserted the the hooks for the medial plate around the distal end of the medial malleolus. I placed two screws in the plate proximally. The C-arm was then brought back in and AP, lateral and mortise views showed restoration of ankle alignment and fracture reduction. Wounds were irrigated once more and then closed in standard layered fashion using 2-0 Vicryl and 3-0 nylon.  A sterile  gently compressive dressing was applied, and then a posterior and stirrup splint with the ankle extended just above neutral.  PROGNOSIS: The patient will be nonweightbearing in the splint with ice and elevation over the next 3 to 5 days.  We will plan to see him back in the office in 10-14 days for removal of sutures and transition to a Cam boot with unrestricted range of motion of the  ankle at that time.  Weightbearing at 8 weeks. Lovenox or aspirin for DVT prophylaxis.

## 2018-05-11 NOTE — Anesthesia Postprocedure Evaluation (Signed)
Anesthesia Post Note  Patient: Lance Collins  Procedure(s) Performed: OPEN REDUCTION INTERNAL FIXATION (ORIF) ANKLE FRACTURE AND SYNDESMOSIS INJURY (Right Ankle)     Patient location during evaluation: PACU Anesthesia Type: Regional and General Level of consciousness: awake and alert Pain management: pain level controlled Vital Signs Assessment: post-procedure vital signs reviewed and stable Respiratory status: spontaneous breathing, nonlabored ventilation, respiratory function stable and patient connected to nasal cannula oxygen Cardiovascular status: blood pressure returned to baseline and stable Postop Assessment: no apparent nausea or vomiting Anesthetic complications: no    Last Vitals:  Vitals:   05/11/18 1408 05/11/18 1425  BP: 103/78 (!) 130/92  Pulse: (!) 43 (!) 53  Resp: 14   Temp:    SpO2: 100% 100%    Last Pain:  Vitals:   05/11/18 1425  TempSrc:   PainSc: 0-No pain                 Keino Placencia

## 2018-05-11 NOTE — Transfer of Care (Signed)
Immediate Anesthesia Transfer of Care Note  Patient: Lance Collins  Procedure(s) Performed: OPEN REDUCTION INTERNAL FIXATION (ORIF) ANKLE FRACTURE AND SYNDESMOSIS INJURY (Right Ankle)  Patient Location: PACU  Anesthesia Type:GA combined with regional for post-op pain  Level of Consciousness: awake, alert , oriented and patient cooperative  Airway & Oxygen Therapy: Patient Spontanous Breathing  Post-op Assessment: Report given to RN and Post -op Vital signs reviewed and stable  Post vital signs: Reviewed and stable  Last Vitals:  Vitals Value Taken Time  BP    Temp    Pulse    Resp    SpO2      Last Pain:  Vitals:   05/11/18 0836  TempSrc:   PainSc: 7       Patients Stated Pain Goal: 4 (05/11/18 0836)  Complications: No apparent anesthesia complications

## 2018-05-11 NOTE — Anesthesia Procedure Notes (Signed)
Anesthesia Regional Block: Popliteal block   Pre-Anesthetic Checklist: ,, timeout performed, Correct Patient, Correct Site, Correct Laterality, Correct Procedure, Correct Position, site marked, Risks and benefits discussed,  Surgical consent,  Pre-op evaluation,  At surgeon's request and post-op pain management  Laterality: Lower and Right  Prep: chloraprep       Needles:  Injection technique: Single-shot  Needle Type: Echogenic Stimulator Needle          Additional Needles:   Procedures:,,,, ultrasound used (permanent image in chart),,,,  Narrative:  Start time: 05/11/2018 8:41 AM End time: 05/11/2018 8:50 AM Injection made incrementally with aspirations every 5 mL.  Performed by: Personally  Anesthesiologist: Val EagleMoser, Renalda Locklin, MD  Additional Notes: H+P and labs reviewed, risks and benefits discussed with patient, procedure tolerated well without complications

## 2018-05-11 NOTE — Anesthesia Procedure Notes (Signed)
Anesthesia Regional Block: Adductor canal block   Pre-Anesthetic Checklist: ,, timeout performed, Correct Patient, Correct Site, Correct Laterality, Correct Procedure, Correct Position, site marked, Risks and benefits discussed,  Surgical consent,  Pre-op evaluation,  At surgeon's request and post-op pain management  Laterality: Lower and Right  Prep: chloraprep       Needles:  Injection technique: Single-shot  Needle Type: Echogenic Stimulator Needle          Additional Needles:   Procedures:,,,, ultrasound used (permanent image in chart),,,,  Narrative:  Start time: 05/11/2018 8:41 AM End time: 05/11/2018 8:50 AM Injection made incrementally with aspirations every 5 mL.  Performed by: Personally  Anesthesiologist: Val EagleMoser, Jessica Checketts, MD  Additional Notes: H+P and labs reviewed, risks and benefits discussed with patient, procedure tolerated well without complications

## 2018-05-11 NOTE — Discharge Instructions (Addendum)
[facility] Discharge Instructions   General Discharge Instructions  WEIGHT BEARING STATUS: Nonweightbearing Right LEg   RANGE OF MOTION/ACTIVITY: ok to move toes and knee, do not remove splint. Ice and elevate you ankle above your heart as much as possible to help with swelling control and pain control  Wound Care: keep splint clean and dry. Do not remove  DVT/PE prophylaxis: Aspirin 325 mg daily x 4 weeks    Pain medications      Percocet (oxycodone/acetaminophen) 5/325: 1-2 pills every 6 hours as needed for moderate to severe pain       Oxycodone 5 mg: 1-2 pills every 6 hours as needed for breakthrough pain. To use this medication between percocet doses.  Only use if absolutely necessary. No refills for this medication.                        Example: you took percocet at 8:00 am, next percocet dose would be at 2:00 pm if needed.  If you had severe pain in between percocet doses you could take oxycodone at 11 am to address this breakthrough pain.       Continue to use Ice and elevate extremity as this helps with pain control.         Being active and participating in therapy is also great for pain control even though this may seem counter-intuitive.   Diet: as you were eating previously.  Can use over the counter stool softeners and bowel preparations, such as Miralax, to help with bowel movements.  Narcotics can be constipating.  Be sure to drink plenty of fluids  PAIN MEDICATION USE AND EXPECTATIONS  You have likely been given narcotic medications to help control your pain.  After a traumatic event that results in an fracture (broken bone) with or without surgery, it is ok to use narcotic pain medications to help control one's pain.  We understand that everyone responds to pain differently and each individual patient will be evaluated on a regular basis for the continued need for narcotic medications. Ideally, narcotic medication use should last no more than 6-8 weeks  (coinciding with fracture healing).   As a patient it is your responsibility as well to monitor narcotic medication use and report the amount and frequency you use these medications when you come to your office visit.   We would also advise that if you are using narcotic medications, you should take a dose prior to therapy to maximize you participation.  IF YOU ARE ON NARCOTIC MEDICATIONS IT IS NOT PERMISSIBLE TO OPERATE A MOTOR VEHICLE (MOTORCYCLE/CAR/TRUCK/MOPED) OR HEAVY MACHINERY DO NOT MIX NARCOTICS WITH OTHER CNS (CENTRAL NERVOUS SYSTEM) DEPRESSANTS SUCH AS ALCOHOL   STOP SMOKING OR USING NICOTINE PRODUCTS!!!!  As discussed nicotine severely impairs your body's ability to heal surgical and traumatic wounds but also impairs bone healing.  Wounds and bone heal by forming microscopic blood vessels (angiogenesis) and nicotine is a vasoconstrictor (essentially, shrinks blood vessels).  Therefore, if vasoconstriction occurs to these microscopic blood vessels they essentially disappear and are unable to deliver necessary nutrients to the healing tissue.  This is one modifiable factor that you can do to dramatically increase your chances of healing your injury.    (This means no smoking, no nicotine gum, patches, etc)  DO NOT USE NONSTEROIDAL ANTI-INFLAMMATORY DRUGS (NSAID'S)  Using products such as Advil (ibuprofen), Aleve (naproxen), Motrin (ibuprofen) for additional pain control during fracture healing can delay and/or prevent the healing  response.  If you would like to take over the counter (OTC) medication, Tylenol (acetaminophen) is ok.  However, some narcotic medications that are given for pain control contain acetaminophen as well. Therefore, you should not exceed more than 4000 mg of tylenol in a day if you do not have liver disease.  Also note that there are may OTC medicines, such as cold medicines and allergy medicines that my contain tylenol as well.  If you have any questions about  medications and/or interactions please ask your doctor/PA or your pharmacist.      ICE AND ELEVATE INJURED/OPERATIVE EXTREMITY  Using ice and elevating the injured extremity above your heart can help with swelling and pain control.  Icing in a pulsatile fashion, such as 20 minutes on and 20 minutes off, can be followed.    Do not place ice directly on skin. Make sure there is a barrier between to skin and the ice pack.    Using frozen items such as frozen peas works well as the conform nicely to the are that needs to be iced.  USE AN ACE WRAP OR TED HOSE FOR SWELLING CONTROL  In addition to icing and elevation, Ace wraps or TED hose are used to help limit and resolve swelling.  It is recommended to use Ace wraps or TED hose until you are informed to stop.    When using Ace Wraps start the wrapping distally (farthest away from the body) and wrap proximally (closer to the body)   Example: If you had surgery on your leg or thing and you do not have a splint on, start the ace wrap at the toes and work your way up to the thigh        If you had surgery on your upper extremity and do not have a splint on, start the ace wrap at your fingers and work your way up to the upper arm  IF YOU ARE IN A SPLINT OR CAST DO NOT REMOVE IT FOR ANY REASON   If your splint gets wet for any reason please contact the office immediately. You may shower in your splint or cast as long as you keep it dry.  This can be done by wrapping in a cast cover or garbage back (or similar)  Do Not stick any thing down your splint or cast such as pencils, money, or hangers to try and scratch yourself with.  If you feel itchy take benadryl as prescribed on the bottle for itching  IF YOU ARE IN A CAM BOOT (BLACK BOOT)  You may remove boot periodically. Perform daily dressing changes as noted below.  Wash the liner of the boot regularly and wear a sock when wearing the boot. It is recommended that you sleep in the boot until told  otherwise  CALL THE OFFICE WITH ANY QUESTIONS OR CONCERNS: [phone]

## 2018-05-11 NOTE — H&P (Signed)
[facility] H&P/Consult     Patient ID: DACE DENN MRN: [id number] DOB/AGE: [birth year] 37 y.o.  Chief Complaint: Right trimalleolar fracture and syndesmosis disruption HPI: Lance Collins is an 37 y.o. male.who sustained ankle fracture on 04/22/18 that required several weeks for soft tissue swelling resolution. Now for ORIF.  Past Medical History:  Diagnosis Date  . Fracture    right ankle  . Inguinal hernia     Past Surgical History:  Procedure Laterality Date  . MULTIPLE TOOTH EXTRACTIONS      Family History  Problem Relation Age of Onset  . Crohn's disease Mother    Social History:  reports that he has never smoked. He has never used smokeless tobacco. He reports that he drinks alcohol. He reports that he does not use drugs. 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  Allergies:  Allergies  Allergen Reactions  . Strawberry Extract Hives    Medications Prior to Admission  Medication Sig Dispense Refill  . ibuprofen (ADVIL,MOTRIN) 200 MG tablet Take 800 mg by mouth every 6 (six) hours as needed for moderate pain.    Marland Kitchen oxyCODONE-acetaminophen (PERCOCET) 5-325 MG tablet Take 1 tablet by mouth every 4 (four) hours as needed for moderate pain. 15 tablet 0  . diphenhydrAMINE (BENADRYL) 25 mg capsule Take 1 capsule (25 mg total) by mouth every 6 (six) hours as needed for itching. (Patient not taking: Reported on 05/10/2018) 30 capsule 0  . loperamide (IMODIUM) 2 MG capsule Take 1 capsule (2 mg total) by mouth as needed for diarrhea or loose stools. (Patient not taking: Reported on 05/10/2018) 30 capsule 0  . ondansetron (ZOFRAN-ODT) 4 MG disintegrating tablet Take 1 tablet (4 mg total) by mouth every 8 (eight) hours as needed for nausea or vomiting. (Patient not taking: Reported on 05/10/2018) 20 tablet 0  . permethrin (ELIMITE) 5 % cream Apply from neck down to the rest of your body, leave it for 8 hrs and wash it off.  Do it once, but if rash persist then  apply again in 1 week. (Patient not taking: Reported on 05/10/2018) 60 g 0    Results for orders placed or performed during the hospital encounter of 05/11/18 (from the past 48 hour(s))  Hemoglobin     Status: None   Collection Time: 05/11/18  8:50 AM  Result Value Ref Range   Hemoglobin 13.0 13.0 - 17.0 g/dL    Comment: Performed at [facility], [address], [place], [place] [address]   No results found.  ROS No recent fever, bleeding abnormalities, urologic dysfunction, GI problems, or weight gain. Blood pressure 124/85, pulse 62, temperature 97.6 F (36.4 C), temperature source Oral, resp. rate 18, height 5\' 7"  (1.702 m), weight 71.7 kg (158 lb), SpO2 100 %. Physical Exam NCAT RRR No wheezing or retractions RLE Dressing intact, clean, dry  Edema/ swelling controlled  Sens: DPN, SPN, TN intact  Motor: EHL, FHL, and lessor toe ext and flex all intact grossly  Brisk cap refill, warm to touch   Assessment/Plan Right comminuted trimalleolar fracture and syndesmotic disruption  I discussed with the patient the risks and benefits of surgery, including the possibility of infection, nerve injury, vessel injury, wound breakdown, arthritis, symptomatic hardware, DVT/ PE, loss of motion, malunion, nonunion, and need for further surgery among others.  He acknowledged these risks and wished to proceed.  Nerve block with d/c to home post op anticipated.  Myrene Galas, MD [facility],  PC [phone]  05/11/2018, 9:33 AM

## 2018-05-11 NOTE — Anesthesia Procedure Notes (Signed)
Procedure Name: LMA Insertion Date/Time: 05/11/2018 10:12 AM Performed by: Pearson Grippeobertson, Loni Abdon M, CRNA Pre-anesthesia Checklist: Patient identified, Emergency Drugs available, Suction available and Patient being monitored Patient Re-evaluated:Patient Re-evaluated prior to induction Oxygen Delivery Method: Circle system utilized Preoxygenation: Pre-oxygenation with 100% oxygen Induction Type: IV induction Ventilation: Mask ventilation without difficulty LMA: LMA inserted LMA Size: 4.0 Number of attempts: 1 Placement Confirmation: positive ETCO2 Tube secured with: Tape Dental Injury: Teeth and Oropharynx as per pre-operative assessment

## 2018-05-11 NOTE — Anesthesia Preprocedure Evaluation (Signed)
Anesthesia Evaluation  Patient identified by MRN, date of birth, ID band Patient awake    Reviewed: Allergy & Precautions, NPO status , Patient's Chart, lab work & pertinent test results  History of Anesthesia Complications Negative for: history of anesthetic complications  Airway Mallampati: I  TM Distance: >3 FB Neck ROM: Full    Dental  (+) Teeth Intact, Missing,    Pulmonary neg pulmonary ROS,    breath sounds clear to auscultation       Cardiovascular negative cardio ROS   Rhythm:Regular     Neuro/Psych negative neurological ROS  negative psych ROS   GI/Hepatic negative GI ROS, Neg liver ROS,   Endo/Other  negative endocrine ROS  Renal/GU negative Renal ROS     Musculoskeletal Right ankle fx   Abdominal   Peds  Hematology negative hematology ROS (+)   Anesthesia Other Findings   Reproductive/Obstetrics                             Anesthesia Physical Anesthesia Plan  ASA: I  Anesthesia Plan: General and Regional   Post-op Pain Management:    Induction: Intravenous  PONV Risk Score and Plan: 2 and Ondansetron and Dexamethasone  Airway Management Planned: LMA  Additional Equipment: None  Intra-op Plan:   Post-operative Plan: Extubation in OR  Informed Consent: I have reviewed the patients History and Physical, chart, labs and discussed the procedure including the risks, benefits and alternatives for the proposed anesthesia with the patient or authorized representative who has indicated his/her understanding and acceptance.   Dental advisory given  Plan Discussed with: CRNA and Surgeon  Anesthesia Plan Comments:         Anesthesia Quick Evaluation

## 2018-05-16 ENCOUNTER — Encounter (HOSPITAL_COMMUNITY): Payer: Self-pay | Admitting: Orthopedic Surgery

## 2019-10-14 IMAGING — CR DG ANKLE COMPLETE 3+V*R*
3 series · 3 of 3 positions shown · non-contrast
Comparison: None.

CLINICAL DATA: Fall with ankle pain.

EXAM:
RIGHT ANKLE - COMPLETE 3+ VIEW

[x ankle ap right]
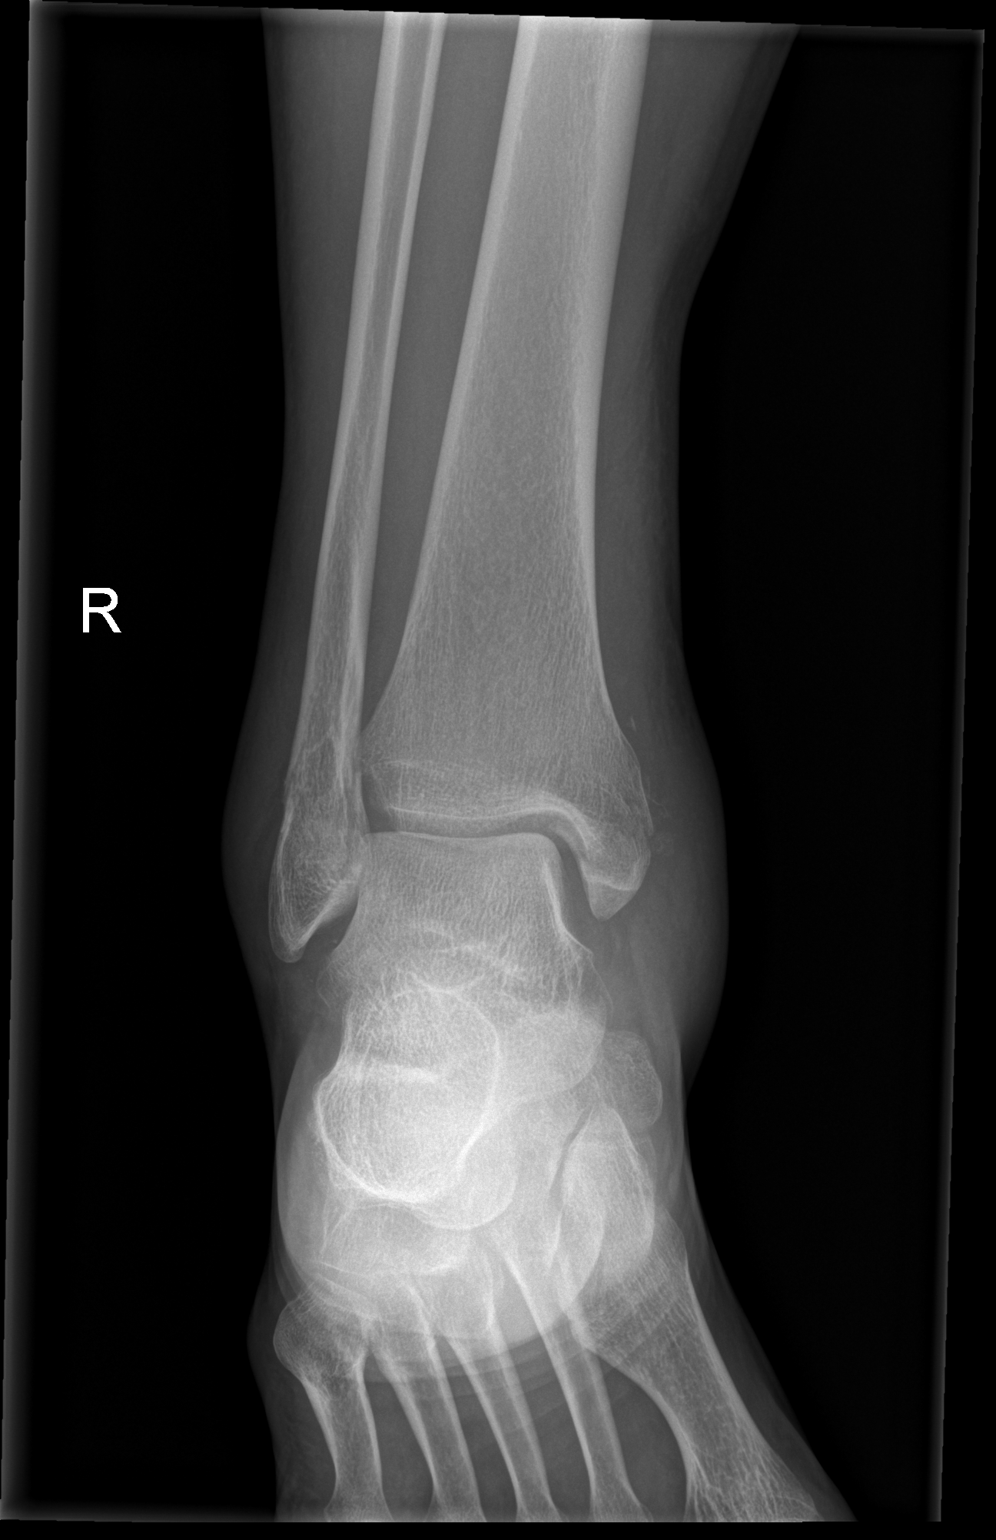

[x ankle obl right]
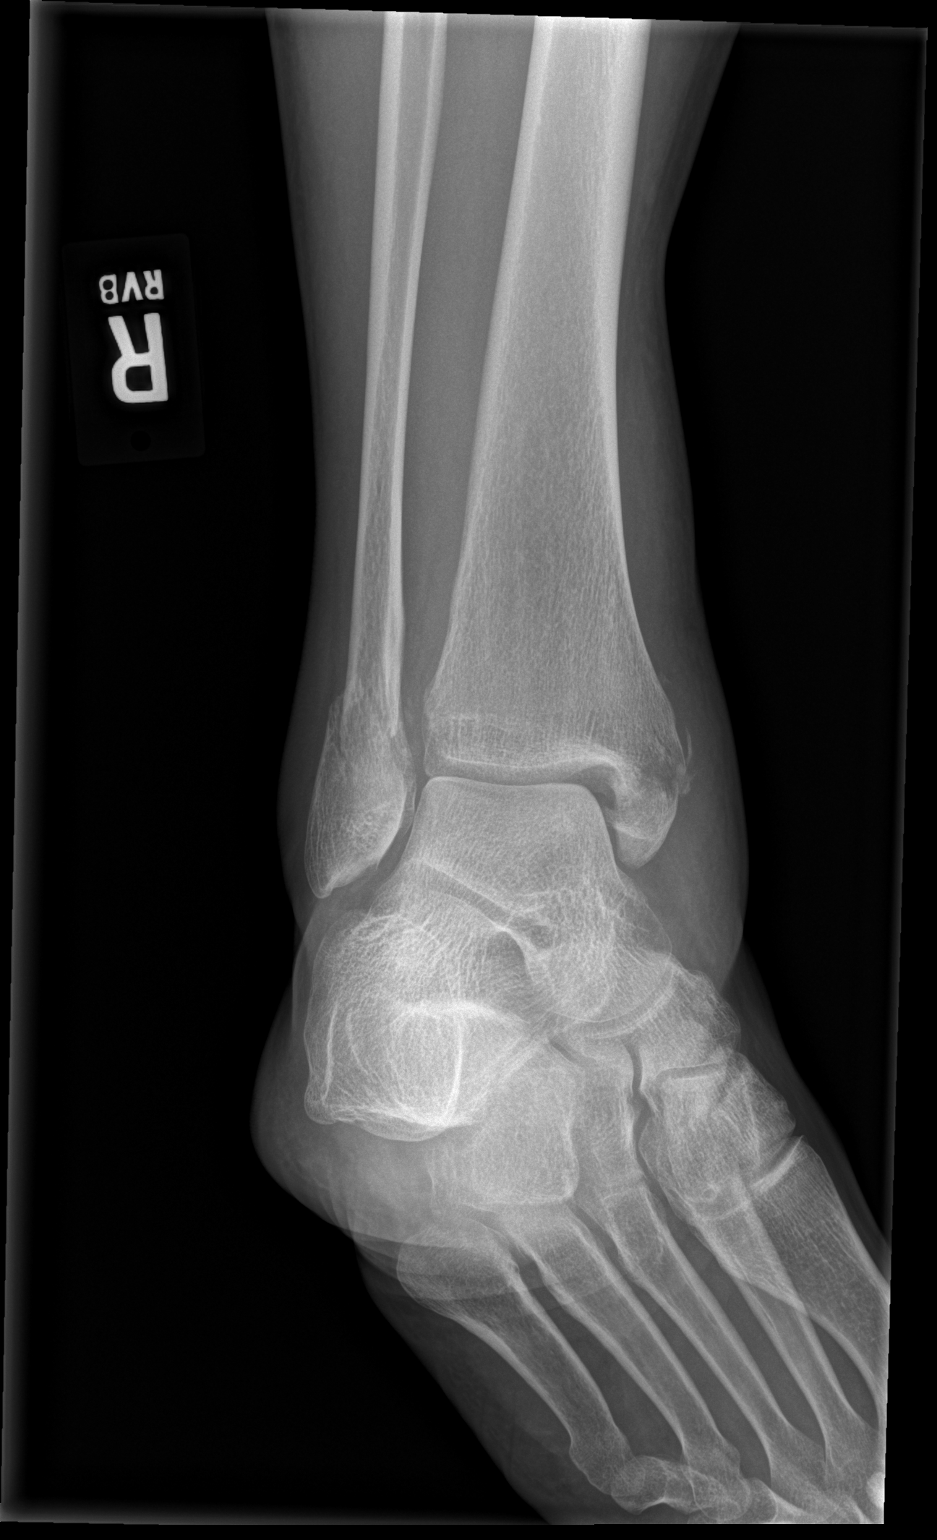

[x ankle lat right]
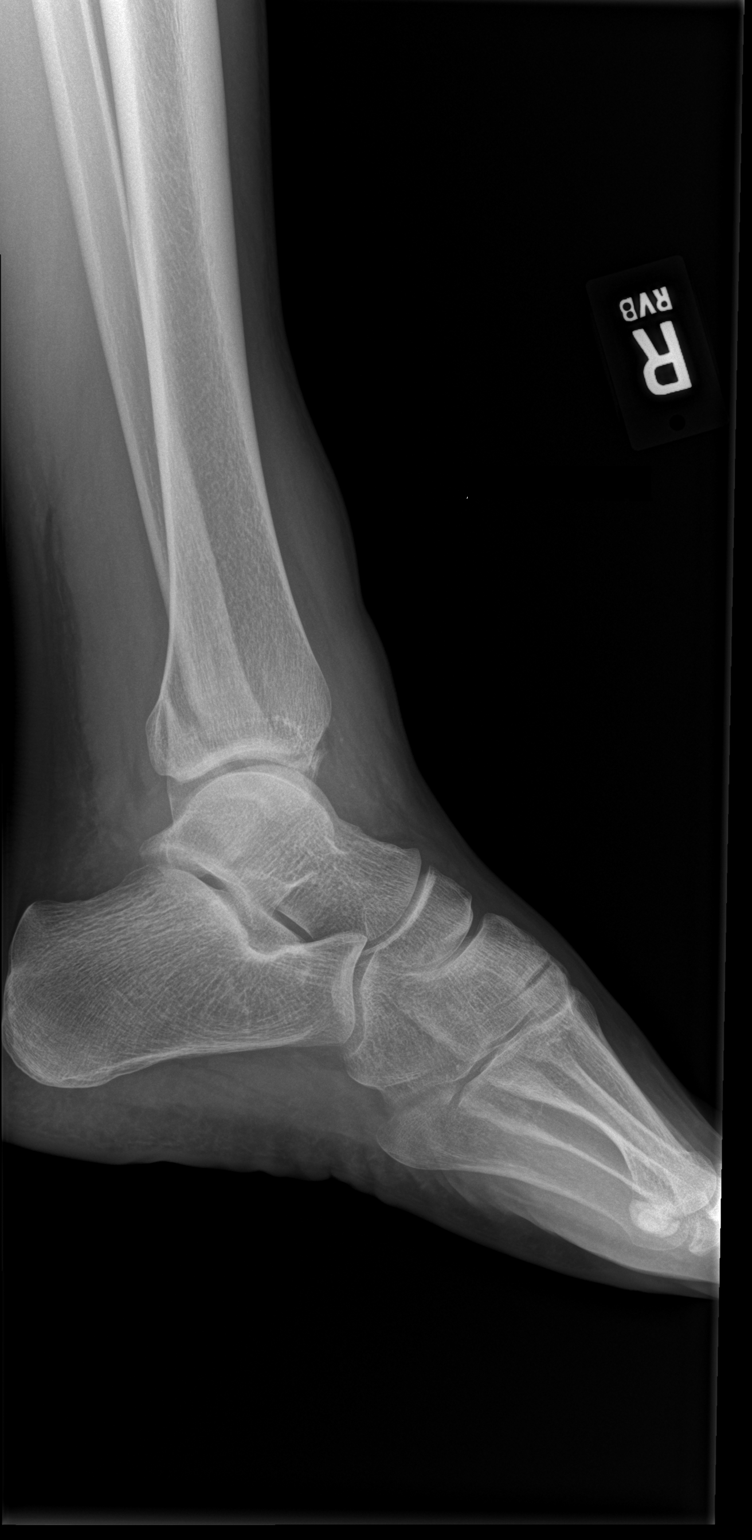

[3 of 3 positions shown; findings below may reference images not displayed]

FINDINGS: There are comminuted, minimally displaced fractures of the medial
and lateral malleolus with severe circumferential soft tissue
swelling.
IMPRESSION: Comminuted, minimally displaced fractures of the medial and lateral
malleoli of the right ankle with severe soft tissue swelling.

## 2020-05-23 ENCOUNTER — Other Ambulatory Visit: Payer: Self-pay | Admitting: Orthopedic Surgery

## 2020-05-23 DIAGNOSIS — M25561 Pain in right knee: Secondary | ICD-10-CM

## 2020-06-05 ENCOUNTER — Ambulatory Visit
Admission: RE | Admit: 2020-06-05 | Discharge: 2020-06-05 | Disposition: A | Discharge status: Home / Self Care | Payer: Medicaid Other | Source: Ambulatory Visit | Attending: Orthopedic Surgery | Admitting: Orthopedic Surgery

## 2020-06-05 ENCOUNTER — Other Ambulatory Visit: Payer: Self-pay

## 2020-06-05 DIAGNOSIS — M25561 Pain in right knee: Secondary | ICD-10-CM

## 2020-07-02 ENCOUNTER — Ambulatory Visit (INDEPENDENT_AMBULATORY_CARE_PROVIDER_SITE_OTHER): Payer: Medicaid Other | Admitting: Orthopedic Surgery

## 2020-07-02 DIAGNOSIS — S83411A Sprain of medial collateral ligament of right knee, initial encounter: Secondary | ICD-10-CM | POA: Diagnosis not present

## 2020-07-02 DIAGNOSIS — S83281A Other tear of lateral meniscus, current injury, right knee, initial encounter: Secondary | ICD-10-CM | POA: Diagnosis not present

## 2020-07-02 DIAGNOSIS — S83511A Sprain of anterior cruciate ligament of right knee, initial encounter: Secondary | ICD-10-CM

## 2020-07-03 ENCOUNTER — Encounter: Payer: Self-pay | Admitting: Orthopedic Surgery

## 2020-07-03 NOTE — Progress Notes (Signed)
Office Visit Note   Patient: Lance Collins           Date of Birth: [birth year]           MRN: [id number] Visit Date: 07/02/2020 Requested by: [facility] [address] [place],  [place] [address] PCP: [facility]  Subjective: Chief Complaint  Patient presents with  . Right Knee - Pain    HPI: Her male is a 39 year old patient with right knee pain. Date of injury 05/13/2020 when he was walking down stairs. He fell and the person behind him actually landed on his knee. Patient underwent ankle fracture fixation and is doing well with that. Also reports right knee pain and instability. He has been weightbearing as tolerated in a Bledsoe brace. No personal or family history of DVT or pulmonary embolism. He is on short-term disability. MRI scan is reviewed and it shows ACL tear with possible small tibial avulsion fracture. There is also evidence of MCL injury and depressed lateral tibial plateau fracture consistent with pivot shift mechanism. There is also a radial tear involving the posterior horn of the lateral meniscus near the meniscal root. 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 Currently he is on short-term disability.              ROS: All systems reviewed are negative as they relate to the chief complaint within the history of present illness.  Patient denies  fevers or chills.   Assessment & Plan: Visit Diagnoses:  1. Rupture of anterior cruciate ligament of right knee, initial encounter   2. Complete tear of medial collateral ligament of right knee, initial encounter   3. Acute lateral meniscus tear of right knee, initial encounter     Plan: Impression is multiligament injury right knee. ACL laxity is present. No posterior lateral rotatory instability is noted. Does have severe lateral meniscal pathology which may or may not be repairable. His MCL also has laxity which needs to be  addressed with repair versus reconstruction. ACL reconstruction also indicated. Risk and benefits are discussed with her male. They include but not limited to infection nerve vessel damage knee stiffness as well as potential for persistent instability. The extensive nature of the rehabilitative process also discussed. Patient understands the risk and benefits and wishes to proceed. All questions answered.  Follow-Up Instructions: Return Return 1 week after surgery.   Orders:  No orders of the defined types were placed in this encounter.  No orders of the defined types were placed in this encounter.     Procedures: No procedures performed   Clinical Data: No additional findings.  Objective: Vital Signs: There were no vitals taken for this visit.  Physical Exam:   Constitutional: Patient appears well-developed HEENT:  Head: Normocephalic Eyes:EOM are normal Neck: Normal range of motion Cardiovascular: Normal rate Pulmonary/chest: Effort normal Neurologic: Patient is alert Skin: Skin is warm Psychiatric: Patient has normal mood and affect    Ortho Exam: Ortho exam demonstrates ACL laxity with positive Lachman and anterior drawer on the right-hand side. No calf tenderness negative Homans on the right. Pedal pulses palpable. Patient does have MCL laxity to valgus stress at both 0 and 30 degrees opening up about 3 to 4 mm at 0 degrees and 5 or 6 at 30 degrees of stress. No opening to varus stress at 0 and 30 degrees. No posterior lateral rotatory instability. PCL intact. Range  of motion is about 0-1 05 at this time. Mild knee effusion is present. Skin is intact in the right knee region  Specialty Comments:  No specialty comments available.  Imaging: No results found.   PMFS History: There are no problems to display for this patient.  Past Medical History:  Diagnosis Date  . Fracture    right ankle  . Inguinal hernia     Family History  Problem Relation Age of Onset    . Crohn's disease Mother     Past Surgical History:  Procedure Laterality Date  . MULTIPLE TOOTH EXTRACTIONS    . ORIF ANKLE FRACTURE Right 05/11/2018   Procedure: OPEN REDUCTION INTERNAL FIXATION (ORIF) ANKLE FRACTURE AND SYNDESMOSIS INJURY;  Surgeon: Myrene Galas, MD;  Location: [facility] OR;  Service: Orthopedics;  Laterality: Right;   Social History   Occupational History  . Not on file  Tobacco Use  . Smoking status: Never Smoker  . Smokeless tobacco: Never Used  Vaping Use  . Vaping Use: Never used  Substance and Sexual Activity  . Alcohol use: Yes    Comment: occ  . Drug use: No  . Sexual activity: Not on file

## 2020-07-15 ENCOUNTER — Telehealth: Payer: Self-pay | Admitting: Orthopedic Surgery

## 2020-07-15 NOTE — Telephone Encounter (Signed)
[employer] forms received. Sent to [employer]. 

## 2020-09-11 ENCOUNTER — Telehealth: Payer: Self-pay | Admitting: Orthopedic Surgery

## 2020-09-11 NOTE — Telephone Encounter (Signed)
[employer] form received. Sent to [employer]. 

## 2020-09-11 NOTE — Telephone Encounter (Signed)
Received call from pt checking if we received his forms. Advised he did received them today and sent to [employer]. Advised $25 form fee and sign auth. He will take of at his appt 10/18

## 2020-09-15 ENCOUNTER — Ambulatory Visit (INDEPENDENT_AMBULATORY_CARE_PROVIDER_SITE_OTHER): Payer: Medicaid Other | Admitting: Orthopedic Surgery

## 2020-09-15 DIAGNOSIS — S83511A Sprain of anterior cruciate ligament of right knee, initial encounter: Secondary | ICD-10-CM | POA: Diagnosis not present

## 2020-09-17 ENCOUNTER — Encounter: Payer: Self-pay | Admitting: Orthopedic Surgery

## 2020-09-17 NOTE — Progress Notes (Signed)
Office Visit Note   Patient: Lance Collins           Date of Birth: 1980-12-18           MRN: [id number] Visit Date: 09/15/2020 Requested by: [facility] [address] [place],  [place] [address] PCP: [facility]  Subjective: Chief Complaint  Patient presents with  . Right Knee - Pain    HPI: Lance Collins male is a patient with right knee pain.  Date of injury 05/13/2020.  Patient had ACL tear lateral meniscal tear and MCL tear.  He is doing about the same.  Still is out of work.  Describes symptomatic instability.  Does have a brace that he wears.  No personal history of DVT or pulmonary embolism.  MRI scan is reviewed and it does show ACL tear MCL tear as well as meniscal root tear on the lateral side.              ROS: All systems reviewed are negative as they relate to the chief complaint within the history of present illness.  Patient denies  fevers or chills.   Assessment & Plan: Visit Diagnoses:  1. Rupture of anterior cruciate ligament of right knee, initial encounter     Plan: Impression is multiligament injury right knee with symptomatic instability and meniscal root avulsion on the lateral side.  Plan is arthroscopy with ACL reconstruction using quadriceps autograft.  Do not want to take the hamstring tendons from that medial side with that MCL issue.  He will also need open MCL reconstruction/repair.  May also need to augment that with internal brace and or allograft.  Meniscal root repair will also be attempted on that lateral side if possible.  The risk and benefits of surgery are discussed including but limited to infection nerve vessel damage knee stiffness incomplete healing persistent instability as well as development of arthritis down the road.  Patient understands the risk and benefits and wishes to proceed.  All questions answered.  Follow-Up Instructions: No follow-ups on file.   Orders:  No orders of the  defined types were placed in this encounter.  No orders of the defined types were placed in this encounter.     Procedures: No procedures performed   Clinical Data: No additional findings.  Objective: Vital Signs: There were no vitals taken for this visit.  Physical Exam:   Constitutional: Patient appears well-developed HEENT:  Head: Normocephalic Eyes:EOM are normal Neck: Normal range of motion Cardiovascular: Normal rate Pulmonary/chest: Effort normal Neurologic: Patient is alert Skin: Skin is warm Psychiatric: Patient has normal mood and affect    Ortho Exam: Ortho exam demonstrates valgus laxity on the right knee at both 0 and 30 degrees.  Has about 3 to 4 mm of opening at full extension compared to 1 mm on the left side.  Has about 5 to 6 mm of opening at 30 degrees of flexion compared to the left side which is about 3 mm.  ACL is out.  PCL intact.  No posterior lateral rotatory instability is noted on the right knee.  Patient has about 7 degrees of hyperextension bilaterally..  Pedal pulses palpable bilaterally ankle dorsiflexion intact.  Specialty Comments:  No specialty comments available.  Imaging: No results found.   PMFS History: There are no problems to display for this patient.  Past Medical History:  Diagnosis Date  . Fracture    right ankle  . Inguinal hernia  Family History  Problem Relation Age of Onset  . Crohn's disease Mother     Past Surgical History:  Procedure Laterality Date  . MULTIPLE TOOTH EXTRACTIONS    . ORIF ANKLE FRACTURE Right 05/11/2018   Procedure: OPEN REDUCTION INTERNAL FIXATION (ORIF) ANKLE FRACTURE AND SYNDESMOSIS INJURY;  Surgeon: Myrene Galas, MD;  Location: [facility] OR;  Service: Orthopedics;  Laterality: Right;   Social History   Occupational History  . Not on file  Tobacco Use  . Smoking status: Never Smoker  . Smokeless tobacco: Never Used  Vaping Use  . Vaping Use: Never used  Substance and Sexual Activity    . Alcohol use: Yes    Comment: occ  . Drug use: No  . Sexual activity: Not on file

## 2020-09-23 ENCOUNTER — Other Ambulatory Visit: Payer: Self-pay

## 2020-09-29 ENCOUNTER — Other Ambulatory Visit (HOSPITAL_COMMUNITY)
Admission: RE | Admit: 2020-09-29 | Discharge: 2020-09-29 | Disposition: A | Discharge status: Home / Self Care | Payer: Medicaid Other | Source: Ambulatory Visit | Attending: Orthopedic Surgery | Admitting: Orthopedic Surgery

## 2020-09-29 DIAGNOSIS — Z01818 Encounter for other preprocedural examination: Secondary | ICD-10-CM | POA: Insufficient documentation

## 2020-09-29 DIAGNOSIS — Z20822 Contact with and (suspected) exposure to covid-19: Secondary | ICD-10-CM | POA: Diagnosis not present

## 2020-09-30 LAB — SARS CORONAVIRUS 2 (TAT 6-24 HRS): SARS Coronavirus 2: NEGATIVE

## 2020-10-01 ENCOUNTER — Other Ambulatory Visit: Payer: Self-pay

## 2020-10-01 ENCOUNTER — Encounter (HOSPITAL_COMMUNITY): Payer: Self-pay | Admitting: Orthopedic Surgery

## 2020-10-01 NOTE — Progress Notes (Signed)
Patient denies shortness of breath, fever, cough or chest pain.  PCP - Therapist, art - n/a  Chest x-ray - n/a EKG - n/a Stress Test - n/a ECHO - n/a Cardiac Cath - n/a  ERAS: Clears til 9:15 am DOS, no drink.  STOP now taking any Aspirin (unless otherwise instructed by your surgeon), Aleve, Naproxen, Ibuprofen, Motrin, Advil, Goody's, BC's, all herbal medications, fish oil, and all vitamins.   Coronavirus Screening Covid test on 09/29/20 was negative  Patient verbalized understanding of instructions that were given via phone.

## 2020-10-02 ENCOUNTER — Other Ambulatory Visit: Payer: Self-pay

## 2020-10-02 ENCOUNTER — Encounter (HOSPITAL_COMMUNITY): Payer: Self-pay | Admitting: Orthopedic Surgery

## 2020-10-02 ENCOUNTER — Ambulatory Visit (HOSPITAL_COMMUNITY)
Admission: RE | Admit: 2020-10-02 | Discharge: 2020-10-02 | Disposition: A | Discharge status: Home / Self Care | Payer: Medicaid Other | Attending: Orthopedic Surgery | Admitting: Orthopedic Surgery

## 2020-10-02 ENCOUNTER — Ambulatory Visit (HOSPITAL_COMMUNITY): Payer: Medicaid Other | Admitting: Certified Registered Nurse Anesthetist

## 2020-10-02 ENCOUNTER — Encounter (HOSPITAL_COMMUNITY)
Admission: RE | Disposition: A | Discharge status: Home / Self Care | Payer: Self-pay | Source: Home / Self Care | Attending: Orthopedic Surgery

## 2020-10-02 DIAGNOSIS — S83281D Other tear of lateral meniscus, current injury, right knee, subsequent encounter: Secondary | ICD-10-CM

## 2020-10-02 DIAGNOSIS — S83511D Sprain of anterior cruciate ligament of right knee, subsequent encounter: Secondary | ICD-10-CM

## 2020-10-02 DIAGNOSIS — S83281A Other tear of lateral meniscus, current injury, right knee, initial encounter: Secondary | ICD-10-CM | POA: Diagnosis not present

## 2020-10-02 DIAGNOSIS — M238X1 Other internal derangements of right knee: Secondary | ICD-10-CM | POA: Insufficient documentation

## 2020-10-02 DIAGNOSIS — Y9389 Activity, other specified: Secondary | ICD-10-CM | POA: Insufficient documentation

## 2020-10-02 DIAGNOSIS — W19XXXA Unspecified fall, initial encounter: Secondary | ICD-10-CM | POA: Diagnosis not present

## 2020-10-02 DIAGNOSIS — S83511A Sprain of anterior cruciate ligament of right knee, initial encounter: Secondary | ICD-10-CM | POA: Diagnosis not present

## 2020-10-02 DIAGNOSIS — S83411D Sprain of medial collateral ligament of right knee, subsequent encounter: Secondary | ICD-10-CM

## 2020-10-02 HISTORY — PX: ANTERIOR CRUCIATE LIGAMENT REPAIR: SHX115

## 2020-10-02 LAB — BASIC METABOLIC PANEL
Anion gap: 11 (ref 5–15)
BUN: 14 mg/dL (ref 6–20)
CO2: 23 mmol/L (ref 22–32)
Calcium: 9.2 mg/dL (ref 8.9–10.3)
Chloride: 105 mmol/L (ref 98–111)
Creatinine, Ser: 1.16 mg/dL (ref 0.61–1.24)
GFR, Estimated: >60 mL/min (ref >60)
Glucose, Bld: 92 mg/dL (ref 70–99)
Potassium: 3.9 mmol/L (ref 3.5–5.1)
Sodium: 139 mmol/L (ref 135–145)

## 2020-10-02 LAB — CBC
HCT: 48.3 % (ref 39.0–52.0)
Hemoglobin: 15.5 g/dL (ref 13.0–17.0)
MCH: 27.9 pg (ref 26.0–34.0)
MCHC: 32.1 g/dL (ref 30.0–36.0)
MCV: 87 fL (ref 80.0–100.0)
Platelets: 244 10*3/uL (ref 150–400)
RBC: 5.55 MIL/uL (ref 4.22–5.81)
RDW: 12.9 % (ref 11.5–15.5)
WBC: 6.2 10*3/uL (ref 4.0–10.5)
nRBC: 0 % (ref 0.0–0.2)

## 2020-10-02 SURGERY — RECONSTRUCTION, KNEE, ACL
Anesthesia: General | Site: Knee | Laterality: Right

## 2020-10-02 MED ORDER — ACETAMINOPHEN 500 MG PO TABS
1000.0000 mg | ORAL_TABLET | Freq: Once | ORAL | Status: AC
  Administered 2020-10-02: 1000 mg via ORAL
  Filled 2020-10-02: qty 2

## 2020-10-02 MED ORDER — PROMETHAZINE HCL 25 MG/ML IJ SOLN: 6.2500 mg | INTRAMUSCULAR | Status: DC | PRN

## 2020-10-02 MED ORDER — CLONIDINE HCL (ANALGESIA) 100 MCG/ML EP SOLN
EPIDURAL | Status: DC | PRN
  Administered 2020-10-02: 1 mL

## 2020-10-02 MED ORDER — POVIDONE-IODINE 7.5 % EX SOLN
Freq: Once | CUTANEOUS | Status: DC
  Filled 2020-10-02: qty 118

## 2020-10-02 MED ORDER — MIDAZOLAM HCL 2 MG/2ML IJ SOLN
INTRAMUSCULAR | Status: AC
  Filled 2020-10-02: qty 2

## 2020-10-02 MED ORDER — MIDAZOLAM HCL 2 MG/2ML IJ SOLN
INTRAMUSCULAR | Status: AC
  Administered 2020-10-02: 2 mg via INTRAVENOUS
  Filled 2020-10-02: qty 2

## 2020-10-02 MED ORDER — SODIUM CHLORIDE 0.9 % IR SOLN
Status: DC | PRN
  Administered 2020-10-02: 12000 mL

## 2020-10-02 MED ORDER — CHLORHEXIDINE GLUCONATE 0.12 % MT SOLN: 15.0000 mL | Freq: Once | OROMUCOSAL | Status: AC

## 2020-10-02 MED ORDER — FENTANYL CITRATE (PF) 100 MCG/2ML IJ SOLN
INTRAMUSCULAR | Status: AC
  Administered 2020-10-02: 100 ug via INTRAVENOUS
  Filled 2020-10-02: qty 2

## 2020-10-02 MED ORDER — HYDROMORPHONE HCL 1 MG/ML IJ SOLN
INTRAMUSCULAR | Status: AC
  Filled 2020-10-02: qty 1

## 2020-10-02 MED ORDER — DEXMEDETOMIDINE (PRECEDEX) IN NS 20 MCG/5ML (4 MCG/ML) IV SYRINGE
PREFILLED_SYRINGE | INTRAVENOUS | Status: AC
  Filled 2020-10-02: qty 5

## 2020-10-02 MED ORDER — POVIDONE-IODINE 10 % EX SWAB: 2.0000 "application " | Freq: Once | CUTANEOUS | Status: DC

## 2020-10-02 MED ORDER — MORPHINE SULFATE (PF) 4 MG/ML IV SOLN
INTRAVENOUS | Status: AC
  Filled 2020-10-02: qty 1

## 2020-10-02 MED ORDER — MIDAZOLAM HCL 2 MG/2ML IJ SOLN: 2.0000 mg | Freq: Once | INTRAMUSCULAR | Status: AC

## 2020-10-02 MED ORDER — BUPIVACAINE HCL (PF) 0.25 % IJ SOLN
INTRAMUSCULAR | Status: AC
  Filled 2020-10-02: qty 30

## 2020-10-02 MED ORDER — MIDAZOLAM HCL 2 MG/2ML IJ SOLN: 0.5000 mg | Freq: Once | INTRAMUSCULAR | Status: DC | PRN

## 2020-10-02 MED ORDER — OXYCODONE HCL 5 MG PO TABS
ORAL_TABLET | ORAL | Status: AC
  Filled 2020-10-02: qty 1

## 2020-10-02 MED ORDER — VANCOMYCIN HCL 1000 MG IV SOLR
INTRAVENOUS | Status: DC | PRN
  Administered 2020-10-02: 1000 mg

## 2020-10-02 MED ORDER — CLONIDINE HCL (ANALGESIA) 100 MCG/ML EP SOLN
EPIDURAL | Status: DC | PRN
  Administered 2020-10-02: 100 ug

## 2020-10-02 MED ORDER — FENTANYL CITRATE (PF) 250 MCG/5ML IJ SOLN
INTRAMUSCULAR | Status: AC
  Filled 2020-10-02: qty 5

## 2020-10-02 MED ORDER — LIDOCAINE HCL (CARDIAC) PF 100 MG/5ML IV SOSY
PREFILLED_SYRINGE | INTRAVENOUS | Status: DC | PRN
  Administered 2020-10-02: 100 mg via INTRATRACHEAL

## 2020-10-02 MED ORDER — BUPIVACAINE-EPINEPHRINE (PF) 0.5% -1:200000 IJ SOLN
INTRAMUSCULAR | Status: DC | PRN
  Administered 2020-10-02: 30 mL via PERINEURAL

## 2020-10-02 MED ORDER — EPHEDRINE 5 MG/ML INJ
INTRAVENOUS | Status: AC
  Filled 2020-10-02: qty 10

## 2020-10-02 MED ORDER — PHENYLEPHRINE 40 MCG/ML (10ML) SYRINGE FOR IV PUSH (FOR BLOOD PRESSURE SUPPORT)
PREFILLED_SYRINGE | INTRAVENOUS | Status: AC
  Filled 2020-10-02: qty 10

## 2020-10-02 MED ORDER — VANCOMYCIN HCL 1000 MG IV SOLR
INTRAVENOUS | Status: AC
  Filled 2020-10-02: qty 1000

## 2020-10-02 MED ORDER — OXYCODONE HCL 5 MG PO TABS
5.0000 mg | ORAL_TABLET | Freq: Once | ORAL | Status: AC | PRN
  Administered 2020-10-02: 5 mg via ORAL

## 2020-10-02 MED ORDER — DEXAMETHASONE SODIUM PHOSPHATE 10 MG/ML IJ SOLN
INTRAMUSCULAR | Status: AC
  Filled 2020-10-02: qty 1

## 2020-10-02 MED ORDER — GLYCOPYRROLATE PF 0.2 MG/ML IJ SOSY
PREFILLED_SYRINGE | INTRAMUSCULAR | Status: AC
  Filled 2020-10-02: qty 1

## 2020-10-02 MED ORDER — PHENYLEPHRINE HCL (PRESSORS) 10 MG/ML IV SOLN
INTRAVENOUS | Status: DC | PRN
  Administered 2020-10-02: 80 ug via INTRAVENOUS
  Administered 2020-10-02: 240 ug via INTRAVENOUS
  Administered 2020-10-02: 80 ug via INTRAVENOUS

## 2020-10-02 MED ORDER — CEFAZOLIN SODIUM-DEXTROSE 2-4 GM/100ML-% IV SOLN
INTRAVENOUS | Status: AC
  Filled 2020-10-02: qty 100

## 2020-10-02 MED ORDER — OXYCODONE-ACETAMINOPHEN 5-325 MG PO TABS
1.0000 | ORAL_TABLET | ORAL | 0 refills | Status: DC | PRN
Start: 2020-10-02 — End: 2020-10-10

## 2020-10-02 MED ORDER — HYDROMORPHONE HCL 1 MG/ML IJ SOLN
0.2500 mg | INTRAMUSCULAR | Status: DC | PRN
  Administered 2020-10-02 (×2): 0.5 mg via INTRAVENOUS

## 2020-10-02 MED ORDER — CHLORHEXIDINE GLUCONATE 0.12 % MT SOLN
OROMUCOSAL | Status: AC
  Administered 2020-10-02: 15 mL via OROMUCOSAL
  Filled 2020-10-02: qty 15

## 2020-10-02 MED ORDER — BUPIVACAINE-EPINEPHRINE (PF) 0.25% -1:200000 IJ SOLN
INTRAMUSCULAR | Status: AC
  Filled 2020-10-02: qty 10

## 2020-10-02 MED ORDER — OXYCODONE HCL 5 MG/5ML PO SOLN: 5.0000 mg | Freq: Once | ORAL | Status: AC | PRN

## 2020-10-02 MED ORDER — DEXAMETHASONE SODIUM PHOSPHATE 4 MG/ML IJ SOLN
INTRAMUSCULAR | Status: DC | PRN
  Administered 2020-10-02: 6 mg via PERINEURAL

## 2020-10-02 MED ORDER — METHOCARBAMOL 500 MG PO TABS: 500.0000 mg | ORAL_TABLET | Freq: Three times a day (TID) | ORAL | 0 refills | Status: DC | PRN

## 2020-10-02 MED ORDER — ASPIRIN EC 81 MG PO TBEC: 81.0000 mg | DELAYED_RELEASE_TABLET | Freq: Every day | ORAL | 2 refills | Status: AC

## 2020-10-02 MED ORDER — SODIUM CHLORIDE 0.9 % IR SOLN
Status: DC | PRN
  Administered 2020-10-02: 1000 mL
  Administered 2020-10-02: 15000 mL

## 2020-10-02 MED ORDER — MORPHINE SULFATE (PF) 4 MG/ML IV SOLN
INTRAVENOUS | Status: DC | PRN
  Administered 2020-10-02: 8 mg

## 2020-10-02 MED ORDER — EPINEPHRINE PF 1 MG/ML IJ SOLN
INTRAMUSCULAR | Status: AC
  Filled 2020-10-02: qty 1

## 2020-10-02 MED ORDER — ONDANSETRON HCL 4 MG/2ML IJ SOLN
INTRAMUSCULAR | Status: DC | PRN
  Administered 2020-10-02: 4 mg via INTRAVENOUS

## 2020-10-02 MED ORDER — MEPERIDINE HCL 25 MG/ML IJ SOLN: 6.2500 mg | INTRAMUSCULAR | Status: DC | PRN

## 2020-10-02 MED ORDER — ONDANSETRON HCL 4 MG/2ML IJ SOLN
INTRAMUSCULAR | Status: AC
  Filled 2020-10-02: qty 2

## 2020-10-02 MED ORDER — BUPIVACAINE HCL (PF) 0.25 % IJ SOLN
INTRAMUSCULAR | Status: DC | PRN
  Administered 2020-10-02: 30 mL

## 2020-10-02 MED ORDER — MIDAZOLAM HCL 2 MG/2ML IJ SOLN
INTRAMUSCULAR | Status: DC | PRN
  Administered 2020-10-02: 2 mg via INTRAVENOUS

## 2020-10-02 MED ORDER — FENTANYL CITRATE (PF) 250 MCG/5ML IJ SOLN
INTRAMUSCULAR | Status: DC | PRN
  Administered 2020-10-02 (×4): 50 ug via INTRAVENOUS

## 2020-10-02 MED ORDER — PROPOFOL 10 MG/ML IV BOLUS
INTRAVENOUS | Status: DC | PRN
  Administered 2020-10-02: 200 mg via INTRAVENOUS

## 2020-10-02 MED ORDER — DEXMEDETOMIDINE HCL 200 MCG/2ML IV SOLN
INTRAVENOUS | Status: DC | PRN
  Administered 2020-10-02: 4 ug via INTRAVENOUS
  Administered 2020-10-02 (×2): 8 ug via INTRAVENOUS

## 2020-10-02 MED ORDER — CLONIDINE HCL (ANALGESIA) 100 MCG/ML EP SOLN
EPIDURAL | Status: AC
  Filled 2020-10-02: qty 10

## 2020-10-02 MED ORDER — ORAL CARE MOUTH RINSE: 15.0000 mL | Freq: Once | OROMUCOSAL | Status: AC

## 2020-10-02 MED ORDER — CEFAZOLIN SODIUM-DEXTROSE 2-4 GM/100ML-% IV SOLN
2.0000 g | INTRAVENOUS | Status: AC
  Administered 2020-10-02: 2 g via INTRAVENOUS

## 2020-10-02 MED ORDER — FENTANYL CITRATE (PF) 100 MCG/2ML IJ SOLN: 100.0000 ug | Freq: Once | INTRAMUSCULAR | Status: AC

## 2020-10-02 MED ORDER — POVIDONE-IODINE 10 % EX SWAB
2.0000 "application " | Freq: Once | CUTANEOUS | Status: AC
  Administered 2020-10-02: 2 via TOPICAL

## 2020-10-02 MED ORDER — LACTATED RINGERS IV SOLN: INTRAVENOUS | Status: DC

## 2020-10-02 MED ORDER — LIDOCAINE 2% (20 MG/ML) 5 ML SYRINGE
INTRAMUSCULAR | Status: AC
  Filled 2020-10-02: qty 5

## 2020-10-02 SURGICAL SUPPLY — 111 items
ANCHOR BUTTON TIGHTROPE RN 14 (Anchor) ×3 IMPLANT
ANCHOR BUTTON TIGHTROPE W/FC3 (Orthopedic Implant) ×3 IMPLANT
ANCHOR SUT BIO SW 4.75X19.1 (Anchor) ×6 IMPLANT
ANCHOR SUT BIOCOMP LK 2.9X12.5 (Anchor) ×3 IMPLANT
BANDAGE ESMARK 6X9 LF (GAUZE/BANDAGES/DRESSINGS) ×1 IMPLANT
BLADE CUTTER GATOR 3.5 (BLADE) ×3 IMPLANT
BLADE EXCALIBUR 4.0MM X 13CM (MISCELLANEOUS) ×1
BLADE EXCALIBUR 4.0X13 (MISCELLANEOUS) ×2 IMPLANT
BLADE SHAVER BONE 5.0MM X 13CM (MISCELLANEOUS) ×1
BLADE SHAVER BONE 5.0X13 (MISCELLANEOUS) ×2 IMPLANT
BLADE SURG 10 STRL SS (BLADE) ×3 IMPLANT
BLADE SURG 15 STRL LF DISP TIS (BLADE) ×3 IMPLANT
BLADE SURG 15 STRL SS (BLADE) ×9
BNDG ELASTIC 4X5.8 VLCR STR LF (GAUZE/BANDAGES/DRESSINGS) ×3 IMPLANT
BNDG ELASTIC 6X15 VLCR STRL LF (GAUZE/BANDAGES/DRESSINGS) ×3 IMPLANT
BNDG ESMARK 6X9 LF (GAUZE/BANDAGES/DRESSINGS) ×3
BURR OVAL 8 FLU 4.0MM X 13CM (MISCELLANEOUS) ×1
BURR OVAL 8 FLU 4.0X13 (MISCELLANEOUS) ×2 IMPLANT
CLOSURE STERI-STRIP 1/2X4 (GAUZE/BANDAGES/DRESSINGS) ×1
CLSR STERI-STRIP ANTIMIC 1/2X4 (GAUZE/BANDAGES/DRESSINGS) ×2 IMPLANT
COVER SURGICAL LIGHT HANDLE (MISCELLANEOUS) ×3 IMPLANT
COVER WAND RF STERILE (DRAPES) IMPLANT
CUFF TOURN SGL QUICK 34 (TOURNIQUET CUFF)
CUFF TOURN SGL QUICK 42 (TOURNIQUET CUFF) IMPLANT
CUFF TRNQT CYL 34X4.125X (TOURNIQUET CUFF) IMPLANT
CUTTER BONE 4.0MM X 13CM (MISCELLANEOUS) ×3 IMPLANT
DECANTER SPIKE VIAL GLASS SM (MISCELLANEOUS) ×3 IMPLANT
DRAPE ARTHROSCOPY W/POUCH 114 (DRAPES) ×3 IMPLANT
DRAPE INCISE IOBAN 66X45 STRL (DRAPES) ×3 IMPLANT
DRAPE OEC MINIVIEW 54X84 (DRAPES) ×3 IMPLANT
DRAPE U-SHAPE 47X51 STRL (DRAPES) ×3 IMPLANT
DRILL FLIPCUTTER III 6-12 (ORTHOPEDIC DISPOSABLE SUPPLIES) ×1 IMPLANT
DRSG PAD ABDOMINAL 8X10 ST (GAUZE/BANDAGES/DRESSINGS) ×9 IMPLANT
DRSG XEROFORM 1X8 (GAUZE/BANDAGES/DRESSINGS) ×3 IMPLANT
DW OUTFLOW CASSETTE/TUBE SET (MISCELLANEOUS) ×3 IMPLANT
ELECT REM PT RETURN 9FT ADLT (ELECTROSURGICAL) ×3
ELECTRODE REM PT RTRN 9FT ADLT (ELECTROSURGICAL) ×1 IMPLANT
FIBERLOOP 2 0 (SUTURE) ×3 IMPLANT
FIBERSTICK 2 (SUTURE) ×3 IMPLANT
FILTER STRAW FLUID ASPIR (MISCELLANEOUS) ×3 IMPLANT
FLIPCUTTER III 6-12 AR-1204FF (ORTHOPEDIC DISPOSABLE SUPPLIES) ×3
GAUZE SPONGE 4X4 12PLY STRL (GAUZE/BANDAGES/DRESSINGS) ×3 IMPLANT
GAUZE XEROFORM 1X8 LF (GAUZE/BANDAGES/DRESSINGS) ×3 IMPLANT
GLOVE BIO SURGEON ST LM GN SZ9 (GLOVE) ×3 IMPLANT
GLOVE BIO SURGEON STRL SZ 6 (GLOVE) ×3 IMPLANT
GLOVE BIO SURGEON STRL SZ 6.5 (GLOVE) ×2 IMPLANT
GLOVE BIO SURGEONS STRL SZ 6.5 (GLOVE) ×1
GLOVE BIOGEL PI IND STRL 8 (GLOVE) ×1 IMPLANT
GLOVE BIOGEL PI INDICATOR 8 (GLOVE) ×2
GLOVE ECLIPSE 8.0 STRL XLNG CF (GLOVE) ×3 IMPLANT
GOWN STRL REUS W/ TWL LRG LVL3 (GOWN DISPOSABLE) ×3 IMPLANT
GOWN STRL REUS W/ TWL XL LVL3 (GOWN DISPOSABLE) ×1 IMPLANT
GOWN STRL REUS W/TWL LRG LVL3 (GOWN DISPOSABLE) ×9
GOWN STRL REUS W/TWL XL LVL3 (GOWN DISPOSABLE) ×3
GRAFT ROPE FROZEN (Tissue) ×3 IMPLANT
HARVESTER TENDON QUADPRO 10 (ORTHOPEDIC DISPOSABLE SUPPLIES) ×3 IMPLANT
IMMOBILIZER KNEE 22 UNIV (SOFTGOODS) ×3 IMPLANT
IMP SYS 2ND FIX PEEK 4.75X19.1 (Miscellaneous) ×3 IMPLANT
IMPL SYS 2ND FX PEEK 4.75X19.1 (Miscellaneous) ×1 IMPLANT
IMPL TIGHTROP ABS ACL FIBERTG (Orthopedic Implant) ×1 IMPLANT
IMPL TIGHTROPE ABS ACL FIBERTG (Orthopedic Implant) ×3 IMPLANT
KIT BASIN OR (CUSTOM PROCEDURE TRAY) ×3 IMPLANT
KIT BIO-TENODESIS 3X8 DISP (MISCELLANEOUS) ×3
KIT BIOCARTILAGE DEL W/SYRINGE (KITS) ×3 IMPLANT
KIT BIOCARTILAGE LG JOINT MIX (KITS) ×3 IMPLANT
KIT INSRT BABSR STRL DISP BTN (MISCELLANEOUS) ×1 IMPLANT
KIT PUSHLOCK 2.9 HIP (KITS) ×3 IMPLANT
KIT TRANSTIBIAL (DISPOSABLE) ×3 IMPLANT
KIT TURNOVER KIT B (KITS) ×3 IMPLANT
MANIFOLD NEPTUNE II (INSTRUMENTS) ×3 IMPLANT
NEEDLE 18GX1X1/2 (RX/OR ONLY) (NEEDLE) ×6 IMPLANT
NEEDLE 22X1 1/2 (OR ONLY) (NEEDLE) ×3 IMPLANT
NEEDLE 25GX 5/8IN NON SAFETY (NEEDLE) ×3 IMPLANT
NEEDLE SUT 2-0 SCORPION KNEE (NEEDLE) ×3 IMPLANT
NS IRRIG 1000ML POUR BTL (IV SOLUTION) ×3 IMPLANT
PACK ARTHROSCOPY DSU (CUSTOM PROCEDURE TRAY) ×3 IMPLANT
PAD ARMBOARD 7.5X6 YLW CONV (MISCELLANEOUS) ×6 IMPLANT
PAD CAST 4YDX4 CTTN HI CHSV (CAST SUPPLIES) ×1 IMPLANT
PADDING CAST COTTON 4X4 STRL (CAST SUPPLIES) ×3
PADDING CAST COTTON 6X4 STRL (CAST SUPPLIES) ×3 IMPLANT
PENCIL BUTTON HOLSTER BLD 10FT (ELECTRODE) IMPLANT
PORT APPOLLO RF 90DEGREE MULTI (SURGICAL WAND) ×3 IMPLANT
PUTTY DBM ALLOSYNC PURE 5CC (Putty) ×3 IMPLANT
SPONGE LAP 4X18 RFD (DISPOSABLE) ×6 IMPLANT
SUCTION FRAZIER HANDLE 10FR (MISCELLANEOUS) ×3
SUCTION TUBE FRAZIER 10FR DISP (MISCELLANEOUS) ×1 IMPLANT
SUT 2 FIBERLOOP 20 STRT BLUE (SUTURE)
SUT ETHILON 3 0 PS 1 (SUTURE) ×15 IMPLANT
SUT MENISCAL KIT (KITS) IMPLANT
SUT MNCRL AB 3-0 PS2 27 (SUTURE) ×6 IMPLANT
SUT VIC AB 0 CT1 27 (SUTURE) ×6
SUT VIC AB 0 CT1 27XBRD ANBCTR (SUTURE) ×2 IMPLANT
SUT VIC AB 1 CT1 27 (SUTURE) ×3
SUT VIC AB 1 CT1 27XBRD ANBCTR (SUTURE) ×1 IMPLANT
SUT VIC AB 2-0 CT1 27 (SUTURE) ×15
SUT VIC AB 2-0 CT1 TAPERPNT 27 (SUTURE) ×5 IMPLANT
SUTURE 2 FIBERLOOP 20 STRT BLU (SUTURE) IMPLANT
SUTURE TAPE TIGERLINK 1.3MM BL (SUTURE) ×4 IMPLANT
SUTURETAPE TIGERLINK 1.3MM BL (SUTURE) ×12
SYR 30ML LL (SYRINGE) ×3 IMPLANT
SYR 3ML LL SCALE MARK (SYRINGE) ×3 IMPLANT
SYR BULB IRRIG 60ML STRL (SYRINGE) ×3 IMPLANT
SYR CONTROL 10ML LL (SYRINGE) ×3 IMPLANT
SYR TB 1ML LUER SLIP (SYRINGE) ×6 IMPLANT
TAPE FIBER 2MM 7IN #2 BLUE (SUTURE) ×3 IMPLANT
TOWEL GREEN STERILE (TOWEL DISPOSABLE) ×6 IMPLANT
TOWEL GREEN STERILE FF (TOWEL DISPOSABLE) ×3 IMPLANT
TUBING ARTHROSCOPY IRRIG 16FT (MISCELLANEOUS) ×3 IMPLANT
UNDERPAD 30X36 HEAVY ABSORB (UNDERPADS AND DIAPERS) ×3 IMPLANT
WATER STERILE IRR 1000ML POUR (IV SOLUTION) ×3 IMPLANT
WRAP KNEE MAXI GEL POST OP (GAUZE/BANDAGES/DRESSINGS) ×3 IMPLANT

## 2020-10-02 NOTE — Anesthesia Procedure Notes (Signed)
Anesthesia Regional Block: Adductor canal block   Pre-Anesthetic Checklist: ,, timeout performed, Correct Patient, Correct Site, Correct Laterality, Correct Procedure, Correct Position, site marked, Risks and benefits discussed,  Surgical consent,  Pre-op evaluation,  At surgeon's request and post-op pain management  Laterality: Right  Prep: chloraprep       Needles:  Injection technique: Single-shot  Needle Type: Stimiplex     Needle Length: 9cm  Needle Gauge: 21     Additional Needles:   Procedures:,,,, ultrasound used (permanent image in chart),,,,  Narrative:  Start time: 10/02/2020 12:08 PM End time: 10/02/2020 12:13 PM Injection made incrementally with aspirations every 5 mL.  Performed by: Personally  Anesthesiologist: Lewie Loron, MD  Additional Notes: BP cuff, EKG monitors applied. Sedation begun. Artery and nerve location verified with U/S and anesthetic injected incrementally, slowly, and after negative aspirations under direct u/s guidance. Good fascial /perineural spread. Tolerated well.

## 2020-10-02 NOTE — Anesthesia Preprocedure Evaluation (Addendum)
Anesthesia Evaluation  Patient identified by MRN, date of birth, ID band Patient awake    Reviewed: Allergy & Precautions, NPO status , Patient's Chart, lab work & pertinent test results  Airway Mallampati: I  TM Distance: >3 FB Neck ROM: Full    Dental  (+) Dental Advisory Given, Teeth Intact   Pulmonary neg pulmonary ROS,  09/29/2020 SARS coronavirus NEG   Pulmonary exam normal breath sounds clear to auscultation       Cardiovascular negative cardio ROS Normal cardiovascular exam Rhythm:Regular Rate:Normal     Neuro/Psych negative neurological ROS     GI/Hepatic negative GI ROS, Neg liver ROS,   Endo/Other  negative endocrine ROS  Renal/GU negative Renal ROS     Musculoskeletal negative musculoskeletal ROS (+)   Abdominal   Peds  Hematology negative hematology ROS (+)   Anesthesia Other Findings   Reproductive/Obstetrics                                                             Anesthesia Evaluation  Patient identified by MRN, date of birth, ID band Patient awake    Reviewed: Allergy & Precautions, NPO status , Patient's Chart, lab work & pertinent test results  History of Anesthesia Complications Negative for: history of anesthetic complications  Airway Mallampati: I  TM Distance: >3 FB Neck ROM: Full    Dental  (+) Teeth Intact, Missing,    Pulmonary neg pulmonary ROS,    breath sounds clear to auscultation       Cardiovascular negative cardio ROS   Rhythm:Regular     Neuro/Psych negative neurological ROS  negative psych ROS   GI/Hepatic negative GI ROS, Neg liver ROS,   Endo/Other  negative endocrine ROS  Renal/GU negative Renal ROS     Musculoskeletal Right ankle fx   Abdominal   Peds  Hematology negative hematology ROS (+)   Anesthesia Other Findings   Reproductive/Obstetrics                              Anesthesia Physical Anesthesia Plan  ASA: I  Anesthesia Plan: General and Regional   Post-op Pain Management:    Induction: Intravenous  PONV Risk Score and Plan: 2 and Ondansetron and Dexamethasone  Airway Management Planned: LMA  Additional Equipment: None  Intra-op Plan:   Post-operative Plan: Extubation in OR  Informed Consent: I have reviewed the patients History and Physical, chart, labs and discussed the procedure including the risks, benefits and alternatives for the proposed anesthesia with the patient or authorized representative who has indicated his/her understanding and acceptance.   Dental advisory given  Plan Discussed with: CRNA and Surgeon  Anesthesia Plan Comments:         Anesthesia Quick Evaluation  Anesthesia Physical Anesthesia Plan  ASA: II  Anesthesia Plan: General   Post-op Pain Management: GA combined w/ Regional for post-op pain   Induction: Intravenous  PONV Risk Score and Plan: 2 and Ondansetron and Dexamethasone  Airway Management Planned: LMA  Additional Equipment: None  Intra-op Plan:   Post-operative Plan: Extubation in OR  Informed Consent: I have reviewed the patients History and Physical, chart, labs and discussed the procedure including the risks, benefits and alternatives for the proposed anesthesia with the  patient or authorized representative who has indicated his/her understanding and acceptance.     Dental advisory given  Plan Discussed with: CRNA  Anesthesia Plan Comments:        Anesthesia Quick Evaluation

## 2020-10-02 NOTE — Brief Op Note (Signed)
   10/02/2020  5:15 PM  PATIENT:  Lance Collins  39 y.o. male  PRE-OPERATIVE DIAGNOSIS:  right knee anterior cruciate ligment tear, medial collateral ligament tear, lateral menisal tear  POST-OPERATIVE DIAGNOSIS:  right knee anterior cruciate ligment tear, medial collateral ligament tear, lateral menisal tear  PROCEDURE:  Procedure(s): right knee anterior cruciate ligament reconstruction quad autograft, meniscal root repair, medial collateral ligament reinforcement  SURGEON:  Surgeon(s): August Saucer, Corrie Mckusick, MD  ASSISTANT: Magnant pa  ANESTHESIA:   general  EBL: 25 ml    Total I/O In: 2100 [I.V.:2000; IV Piggyback:100] Out: 410 [Urine:300; Blood:110]  BLOOD ADMINISTERED: none  DRAINS: none   LOCAL MEDICATIONS USED: marcaine mso4 clonidine  SPECIMEN:  No Specimen  COUNTS:  YES  TOURNIQUET:   Total Tourniquet Time Documented: Thigh (Right) - 18 minutes Total: Thigh (Right) - 18 minutes   DICTATION: .Other Dictation: Dictation Number [id number]  PLAN OF CARE: Discharge to home after PACU  PATIENT DISPOSITION:  PACU - hemodynamically stable

## 2020-10-02 NOTE — Anesthesia Postprocedure Evaluation (Signed)
Anesthesia Post Note  Patient: Lance Collins  Procedure(s) Performed: right knee anterior cruciate ligament reconstruction quad autograft, meniscal root repair, medial collateral ligament repair (Right Knee)     Patient location during evaluation: PACU Anesthesia Type: General and Regional Level of consciousness: awake Pain management: pain level controlled Vital Signs Assessment: post-procedure vital signs reviewed and stable Respiratory status: spontaneous breathing, nonlabored ventilation, respiratory function stable and patient connected to nasal cannula oxygen Cardiovascular status: blood pressure returned to baseline and stable Postop Assessment: no apparent nausea or vomiting Anesthetic complications: no   No complications documented.  Last Vitals:  Vitals:   10/02/20 1747 10/02/20 1802  BP: 106/84 120/83  Pulse: 64 70  Resp: 14 20  Temp: 36.5 C   SpO2: 100% 97%    Last Pain:  Vitals:   10/02/20 1800  TempSrc:   PainSc: 3                  Monay Houlton P Zeidy Tayag

## 2020-10-02 NOTE — Transfer of Care (Signed)
Immediate Anesthesia Transfer of Care Note  Patient: Lance Collins  Procedure(s) Performed: right knee anterior cruciate ligament reconstruction quad autograft, meniscal root repair, medial collateral ligament repair (Right Knee)  Patient Location: PACU  Anesthesia Type:General and Regional  Level of Consciousness: lethargic and responds to stimulation  Airway & Oxygen Therapy: Patient Spontanous Breathing  Post-op Assessment: Report given to RN  Post vital signs: Reviewed and stable  Last Vitals:  Vitals Value Taken Time  BP 128/81 10/02/20 1717  Temp    Pulse 75 10/02/20 1718  Resp 17 10/02/20 1718  SpO2 100 % 10/02/20 1718  Vitals shown include unvalidated device data.  Last Pain:  Vitals:   10/02/20 1056  TempSrc:   PainSc: 0-No pain      Patients Stated Pain Goal: 4 (10/02/20 1056)  Complications: No complications documented.

## 2020-10-02 NOTE — H&P (Addendum)
Lance Collins is an 39 y.o. male.   Chief Complaint: Right knee instability HPI: Patient is a 39 year old male with right knee instability. Does office work. Was going down the stairs in June when someone fell on him and actually fell on his knee. Had ankle fracture at the time which was fixed. Has had persistent right knee instability since that time. MRI scan shows meniscal root tearing of the posterior lateral meniscal horn as well as ACL tear and MCL tear. 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 Presents now for operative management after explanation of risks and benefits. No personal or family history of DVT or pulmonary embolism.  Past Medical History:  Diagnosis Date  . Fracture    right ankle  . Inguinal hernia     Past Surgical History:  Procedure Laterality Date  . MULTIPLE TOOTH EXTRACTIONS    . ORIF ANKLE FRACTURE Right 05/11/2018   Procedure: OPEN REDUCTION INTERNAL FIXATION (ORIF) ANKLE FRACTURE AND SYNDESMOSIS INJURY;  Surgeon: Myrene Galas, MD;  Location: [facility] OR;  Service: Orthopedics;  Laterality: Right;    Family History  Problem Relation Age of Onset  . Crohn's disease Mother    Social History:  reports that he has never smoked. He has never used smokeless tobacco. He reports current alcohol use of about 2.0 standard drinks of alcohol per week. He reports that he does not use drugs.  Allergies:  Allergies  Allergen Reactions  . Strawberry Extract Hives    No medications prior to admission.    Results for orders placed or performed during the hospital encounter of 10/02/20 (from the past 48 hour(s))  CBC     Status: None   Collection Time: 10/02/20 10:14 AM  Result Value Ref Range   WBC 6.2 4.0 - 10.5 K/uL   RBC 5.55 4.22 - 5.81 MIL/uL   Hemoglobin 15.5 13.0 - 17.0 g/dL   HCT 41.9 39 - 52 %   MCV 87.0 80.0 - 100.0 fL   MCH 27.9 26.0 - 34.0 pg   MCHC 32.1 30.0 - 36.0 g/dL   RDW 62.2 29.7 - 98.9 %   Platelets 244 150 - 400 K/uL   nRBC 0.0 0.0 -  0.2 %    Comment: Performed at [facility], [address], [place], [place] [address]   No results found.  Review of Systems  Musculoskeletal: Positive for arthralgias.  All other systems reviewed and are negative.   Blood pressure 140/90, pulse (!) 59, temperature 97.7 F (36.5 C), temperature source Oral, resp. rate 20, height 5\' 7"  (1.702 m), weight 74.8 kg, SpO2 100 %. Physical Exam Vitals reviewed.  HENT:     Head: Normocephalic.     Nose: Nose normal.     Mouth/Throat:     Mouth: Mucous membranes are moist.  Eyes:     Pupils: Pupils are equal, round, and reactive to light.  Cardiovascular:     Rate and Rhythm: Normal rate.     Pulses: Normal pulses.  Pulmonary:     Effort: Pulmonary effort is normal.  Abdominal:     General: Abdomen is flat.  Musculoskeletal:     Cervical back: Normal range of motion.  Skin:    General: Skin is warm.     Capillary Refill: Capillary refill takes less than 2 seconds.  Neurological:     General: No focal deficit present.     Mental Status: He is alert.  Psychiatric:  Mood and Affect: Mood normal.   Right knee examination demonstrates near full extension with flexion to 125. ACL laxity is present. Extensor mechanism is intact. Patient has instability to valgus stress at both 0 and 30 degrees. Opens up about 4 and 7 mm respectively. No posterior lateral rotatory instability. PCL is intact. Pedal pulses palpable. Ankle dorsiflexion intact. Skin is intact in that right knee region  Assessment/Plan Impression is right knee multiligament injury. Meniscal root avulsion also present. Patient had a fairly significant tibial plateau fracture type mechanism at the time of injury. Plan is ACL reconstruction using quad autograft.  ACL repair is also a possibility based on the appearance of the ACL on the scan.  If robust tissue is present and an evulsion fracture is present then I think it is conceivable we may be able to repair the ACL  as opposed to reconstruct it.  We also plan on MCL repair/reconstruction/augmentation with internal brace, also patient will need meniscal debridement versus repair on that lateral side. Risk benefits are discussed. They include but not limited to knee stiffness persistent instability failure of the repair as well as infection. Patient understands risk benefits and wishes to proceed. All questions answered.  Burnard Bunting, MD 10/02/2020, 11:45 AM

## 2020-10-02 NOTE — Op Note (Signed)
Lance Collins, Lance Collins MEDICAL RECORD IR:[id number] ACCOUNT [id number] DATE OF BIRTH:22-Oct-1981 FACILITY: [facility] LOCATION: [facility] PHYSICIAN:Mechille Varghese Diamantina Providence, MD  OPERATIVE REPORT  DATE OF PROCEDURE:  10/02/2020  PREOPERATIVE DIAGNOSES:   1.  Right knee anterior cruciate ligament tear.   2.  Lateral meniscal root tear.   3.  Medial collateral ligament laxity.  POSTOPERATIVE DIAGNOSES:   1.  Right knee anterior cruciate ligament tear.   2.  Lateral meniscal root tear.   3.  Medial collateral ligament laxity.  PROCEDURE: 1.  Right knee arthroscopy with anterior cruciate ligament reconstruction using quad autograft supplemented with semitendinosis allograft. 2.  Right knee anterior lateral meniscus root repair. 3.  Medical collateral ligament augmentation using internal brace.  SURGEON:  Cammy Copa, MD  ASSISTANT:  Karenann Cai, PA.  INDICATIONS:  The patient is a 39 year old patient with right knee instability following an accident in June.  He presents for operative management after explanation of risks and benefits.  PROCEDURE IN DETAIL:  The patient was brought to the operating room where general anesthetic was induced.  Preoperative antibiotics administered.  Timeout was called.  Right leg was examined under anesthesia.  The patient had about 10 degrees of  hyperextension bilaterally, full flexion on the right.  He had ACL laxity, but no posterolateral rotatory instability.  The patient had good stability to varus stress at 0 and 30 degrees.  To valgus stress, he had about 2 mm of opening at 0 degrees of  flexion and about 4-5 mm of opening at 30 degrees of flexion.  Both had solid endpoints.  Right leg was then pre-scrubbed with alcohol and Betadine, allowed to air dry, prepped with DuraPrep solution and draped in sterile manner.  Ioban used to cover the  operative field.  Timeout was called.  Anterior inferolateral and anterior inferomedial portals were anesthetized using  Marcaine with epinephrine.  Anterior inferolateral portal was created.  Anterior inferomedial portal was created.  The patient did  have an avulsion on preoperative MRI scanning, which we wanted to evaluate for possible repair prior to harvesting the quad autograft.  Next, the debridement was performed anteriorly.  Then, inspection of the ACL was made.  There was a fragment which was  present which had the ACL attached to it.  There was also evidence of proximal injury with this attachment of at least 1, if not more of the bundles of the ACL.  Despite the distal portion, the attached to bone along with the anterior horn of the  lateral meniscus, this ligament was not deemed stable enough, even if repaired.  For that reason, it was debrided.  Anterior horn of the lateral meniscus was subsequently repaired using 2 fiber lengths and PushLock back to the anterior horn attachment  site.  This was done through an accessory mid medial portal.  Next, the knee joint was inspected.  The medial compartment intact, lateral compartment, intact articular cartilage.  Meniscus on the medial side intact.  The posterior horn of the lateral  meniscus had also healed in the area where there was a cleft.  Anterior horn was repaired as described earlier.  Notchplasty performed.  Quad tendon was then harvested and its proximal portion was narrower than its distal portion, which was about 9 mm.   This was augmented with a semitendinosis allograft and prepared on the back table.  This was done by Karenann Cai, PA.  Secure fixation and good graft was obtained.  The tunnels were then drilled in  the 9 o'clock position on the femur and through the  posterior aspect of the native ACL footprint on the tibia.  The graft was passed.  Bone graft was placed into the tunnels.  Fluoroscopy used to confirm graft flipping of the button.  The graft was then tightened in extension and placed over an  Endobutton, supplemented with a SwiveLock.  Bone  quality was marginal for a 39 year old male.  Secure fixation was achieved and excellent stability was present.  The medial side was then reexamined after tightening of the ACL.  The patient had very good  stability with only about 1 mm laxity to valgus stress at 0 degrees.  He had about 3-4 mm of laxity to valgus stress at 30 degrees.  Internal brace was then applied at the isometric point on the medial femur through a small incision, as well as tunneling  that brace through the tibia, attaching into the isometric point just above the pes tendon insertion on the posterior aspect of the medial tibial crest.  This gave very good checkrein stability in 30 degrees.  Isometric point was confirmed using suture  and range of motion.  Next, thorough irrigation was performed.  All portals were then irrigated and closed using 2-0 Vicryl and 3-0 nylon.  Harvest sites closed using a 2-0 Vicryl and 3-0 nylon.  Solution of Marcaine, morphine, clonidine injected into  the knee. 3-0 Monocryl used to close up the quad harvest site.  The quadriceps mechanism itself was closed using #1 Vicryl suture.  Steri-Strips applied.  Impervious dressings applied.  The patient tolerated the procedure well without immediate  complications.  Transferred to the recovery room in stable condition.  Luke's assistance was required for graft preparation, mobilization, opening and closing.  His assistance was a medical necessity.  VN/NUANCE  D:10/02/2020 T:10/02/2020 JOB:[id number]

## 2020-10-02 NOTE — Anesthesia Procedure Notes (Signed)
Procedure Name: LMA Insertion Date/Time: 10/02/2020 12:55 PM Performed by: Modena Morrow, CRNA Pre-anesthesia Checklist: Patient identified, Emergency Drugs available, Suction available and Patient being monitored Patient Re-evaluated:Patient Re-evaluated prior to induction Oxygen Delivery Method: Circle system utilized Preoxygenation: Pre-oxygenation with 100% oxygen Induction Type: IV induction Ventilation: Mask ventilation without difficulty LMA: LMA inserted LMA Size: 5.0 Placement Confirmation: positive ETCO2 and breath sounds checked- equal and bilateral Tube secured with: Tape Dental Injury: Teeth and Oropharynx as per pre-operative assessment

## 2020-10-03 ENCOUNTER — Encounter (HOSPITAL_COMMUNITY): Payer: Self-pay | Admitting: Orthopedic Surgery

## 2020-10-10 ENCOUNTER — Ambulatory Visit (INDEPENDENT_AMBULATORY_CARE_PROVIDER_SITE_OTHER): Payer: Medicaid Other | Admitting: Orthopedic Surgery

## 2020-10-10 ENCOUNTER — Other Ambulatory Visit: Payer: Self-pay

## 2020-10-10 ENCOUNTER — Encounter: Payer: Self-pay | Admitting: Orthopedic Surgery

## 2020-10-10 DIAGNOSIS — S83511A Sprain of anterior cruciate ligament of right knee, initial encounter: Secondary | ICD-10-CM

## 2020-10-10 MED ORDER — OXYCODONE-ACETAMINOPHEN 5-325 MG PO TABS: 1.0000 | ORAL_TABLET | Freq: Three times a day (TID) | ORAL | 0 refills | Status: DC | PRN

## 2020-10-10 NOTE — Progress Notes (Signed)
   Post-Op Visit Note   Patient: Lance Collins           Date of Birth: July 25, 1981           MRN: [id number] Visit Date: 10/10/2020 PCP: [facility]   Assessment & Plan:  Chief Complaint:  Chief Complaint  Patient presents with  . Right Knee - Routine Post Op   Visit Diagnoses:  1. Rupture of anterior cruciate ligament of right knee, initial encounter     Plan: Patient is 39 year old male presents s/p right knee arthroscopy with ACL reconstruction with quadricep autograft, anterior horn lateral root meniscal repair, MCL repair with internal brace on 10/02/2020.  Patient states that he is progressing slowly.  He is using CPM machine is up to 65 degrees.  On exam he flexes from 0 to 5 degrees of extension to about 60 degrees of flexion.  He has a mild postoperative effusion.  ACL graft is stable on exam.  No calf tenderness, negative Homans' sign.  MCL is stable to valgus stress at 0 and 30 degrees.  Incisions are healing well and sutures were removed today and replaced with Steri-Strips.  He is currently nonweightbearing and will continue to maintain his nonweightbearing status over the next 3 weeks.  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  Plan to start physical therapy in 2 weeks.  Follow-up in 2 weeks for clinical recheck, mostly to check range of motion.  Due to the mild effusion, 25 cc of fluid was aspirated from the knee and patient states that this provided some relief.  Plan to follow-up in 2 weeks.  Follow-Up Instructions: No follow-ups on file.   Orders:  Orders Placed This Encounter  Procedures  . Ambulatory referral to Physical Therapy   Meds ordered this encounter  Medications  . oxyCODONE-acetaminophen (PERCOCET) 5-325 MG tablet    Sig: Take 1 tablet by mouth every 8 (eight) hours as needed for severe pain.    Dispense:  30 tablet    Refill:  0    Imaging: No results found.  PMFS History: There are no problems to display for this  patient.  Past Medical History:  Diagnosis Date  . Fracture    right ankle  . Inguinal hernia     Family History  Problem Relation Age of Onset  . Crohn's disease Mother     Past Surgical History:  Procedure Laterality Date  . ANTERIOR CRUCIATE LIGAMENT REPAIR Right 10/02/2020   Procedure: right knee anterior cruciate ligament reconstruction quad autograft, meniscal root repair, medial collateral ligament repair;  Surgeon: Cammy Copa, MD;  Location: [facility] OR;  Service: Orthopedics;  Laterality: Right;  . MULTIPLE TOOTH EXTRACTIONS    . ORIF ANKLE FRACTURE Right 05/11/2018   Procedure: OPEN REDUCTION INTERNAL FIXATION (ORIF) ANKLE FRACTURE AND SYNDESMOSIS INJURY;  Surgeon: Myrene Galas, MD;  Location: [facility] OR;  Service: Orthopedics;  Laterality: Right;   Social History   Occupational History  . Not on file  Tobacco Use  . Smoking status: Never Smoker  . Smokeless tobacco: Never Used  Vaping Use  . Vaping Use: Never used  Substance and Sexual Activity  . Alcohol use: Yes    Alcohol/week: 2.0 standard drinks    Types: 2 Cans of beer per week  . Drug use: No  . Sexual activity: Not on file

## 2020-10-16 ENCOUNTER — Other Ambulatory Visit: Payer: Self-pay

## 2020-10-16 ENCOUNTER — Ambulatory Visit: Payer: Medicaid Other | Attending: Orthopedic Surgery | Admitting: Physical Therapy

## 2020-10-16 ENCOUNTER — Encounter: Payer: Self-pay | Admitting: Physical Therapy

## 2020-10-16 DIAGNOSIS — R2689 Other abnormalities of gait and mobility: Secondary | ICD-10-CM | POA: Diagnosis present

## 2020-10-16 DIAGNOSIS — R600 Localized edema: Secondary | ICD-10-CM | POA: Insufficient documentation

## 2020-10-16 DIAGNOSIS — M25561 Pain in right knee: Secondary | ICD-10-CM | POA: Insufficient documentation

## 2020-10-16 DIAGNOSIS — M25661 Stiffness of right knee, not elsewhere classified: Secondary | ICD-10-CM | POA: Insufficient documentation

## 2020-10-17 ENCOUNTER — Encounter: Payer: Self-pay | Admitting: Physical Therapy

## 2020-10-17 NOTE — Therapy (Addendum)
[facility] [address] [place], [place], [address] Phone: [phone]   Fax:  [phone]  Physical Therapy Evaluation  Patient Details  Name: Maclane Holloran MRN: [id number] Date of Birth: Apr 03, 1981 Referring Provider (PT): Dr Cammy Copa    Encounter Date: 10/16/2020   PT End of Session - 10/16/20 1555    Visit Number 1    Number of Visits 16    Date for PT Re-Evaluation 12/11/20    Authorization Type [employer] M[employer]    PT Start Time 1500    PT Stop Time 1545    PT Time Calculation (min) 45 min    Activity Tolerance Patient tolerated treatment well    Behavior During Therapy [facility] for tasks assessed/performed           Past Medical History:  Diagnosis Date  . Fracture    right ankle  . Inguinal hernia     Past Surgical History:  Procedure Laterality Date  . ANTERIOR CRUCIATE LIGAMENT REPAIR Right 10/02/2020   Procedure: right knee anterior cruciate ligament reconstruction quad autograft, meniscal root repair, medial collateral ligament repair;  Surgeon: Cammy Copa, MD;  [facility]: [facility] OR;  Service: Orthopedics;  Laterality: Right;  . MULTIPLE TOOTH EXTRACTIONS    . ORIF ANKLE FRACTURE Right 05/11/2018   Procedure: OPEN REDUCTION INTERNAL FIXATION (ORIF) ANKLE FRACTURE AND SYNDESMOSIS INJURY;  Surgeon: Myrene Galas, MD;  [facility]: [facility] OR;  Service: Orthopedics;  Laterality: Right;    There were no vitals filed for this visit.    Subjective Assessment - 10/16/20 1504    Subjective Patient fell down the stairs in June of 2021. he was walking on it for the months following. MRI reviealed extensive damgae in his knee. He underwent and ACL reconstrruction/ Lateral Meniscal Root repair/ and Righ MCL internal bracing    Pertinent History Right ankle Fracture 2019/    How long can you sit comfortably? Stiffens when he sits    How long can you stand comfortably? can stand with crutches    How long can you  walk comfortably? limited distances    Diagnostic tests Nothing post-op    Patient Stated Goals Being able to walk and extend his leg    Currently in Pain? Yes    Pain Score 5    up to a 7-8/10 with movement   Pain Type Acute pain;Surgical pain    Pain Radiating Towards can radiate to the calf    Pain Onset 1 to 4 weeks ago    Pain Frequency Intermittent    Aggravating Factors  bending the leg; sitffens up at night    Pain Relieving Factors rest, pain medications    Effect of Pain on Daily Activities difficulty perfroming ADL's              [facility] PT Assessment - 10/17/20 0001      Assessment   Medical Diagnosis Right ACL repair/ Lateral Meiniscal repair/ MCL stabilization     Referring Provider (PT) Dr Cammy Copa     Onset Date/Surgical Date 10/02/20    Hand Dominance Right    Next MD Visit 10/21/2020     Prior Therapy None       Precautions   Precautions Knee      Restrictions   Weight Bearing Restrictions Yes    RLE Weight Bearing Non weight bearing      Balance Screen   Has the patient fallen in the past 6 months Yes  How many times? 1   fell down some stairs    Has the patient had a decrease in activity level because of a fear of falling?  No    Is the patient reluctant to leave their home because of a fear of falling?  No      Home Environment   Additional Comments 20 steps into his apartment.       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 Patient report MD told him he dosent have to use a brace     Focus on Therapeutic Outcomes (FOTO)  Medicaid       Sensation   Light Touch Appears Intact      Coordination   Gross Motor Movements are Fluid and Coordinated  Yes    Fine Motor Movements are Fluid and Coordinated Yes      AROM   AROM Assessment Site Knee    Right/Left Knee Right    Right Knee Extension -10    Right Knee Flexion 75      PROM   PROM Assessment Site Knee    Right/Left Knee Right    Right Knee Extension -7    Right Knee Flexion 80      Strength   Overall Strength Comments no formally tested 2nd to recent surgery. Can not lift his leg against gracvity       Palpation   Palpation comment no unexpected tenderness to palpation       Ambulation/Gait   Gait Comments using crutches. Patient is non-weight bearing                       Objective measurements completed on examination: See above findings.       [facility] Adult PT Treatment/Exercise - 10/17/20 0001      Exercises   Exercises Knee/Hip      Knee/Hip Exercises: Stretches   Other Knee/Hip Stretches knee extension on towel; reviewed potential stretching techniques     Other Knee/Hip Stretches knee flexion stretch with strap. reviewed range goal of 90. Patient at 46-      Knee/Hip Exercises: Supine   Quad Sets Limitations 2x10 with mod cuing 5 second hold with pillow                   PT Education - 10/17/20 0756    Education Details improtance of regaining quad firing, self stretching    Person(s) Educated Patient    Methods Explanation;Demonstration;Tactile cues;Verbal cues    Comprehension Verbalized understanding;Returned demonstration;Verbal cues required;Tactile cues required            PT Short Term Goals - 10/17/20 1140      PT SHORT TERM GOAL #1   Title Patient will increase passive knee flexion to 100 degrees without pain    Baseline 80    Time 3    Period Weeks    Status New    Target Date 11/07/20      PT SHORT TERM GOAL #2   Title Patient will perfrom SLR without assistance    Baseline can not perfrom    Time 3  Period Weeks    Status New    Target Date 11/07/20      PT SHORT TERM GOAL #3   Title Patient  will demonstrate full extension without pain    Baseline -7    Time 3    Period Weeks    Status New    Target Date 06/12/21             PT Long Term Goals - 10/17/20 1141      PT LONG TERM GOAL #1   Title Patient will be indepdnent with HEP    Baseline does not have HEP    Time 6    Period Weeks    Status New    Target Date 11/28/20      PT LONG TERM GOAL #2   Title Patient will ambulate 3000' withoiut AD with no sign of knee buckling    Baseline using crutches. NWB at this time    Time 6    Period Weeks    Status New    Target Date 11/28/20                  Plan - 10/17/20 0757    Clinical Impression Statement Patient is a 39 year old male S/P ACL reapir, lateral meniscal reapiar, and MCL stabilization. He presents with exected limitations in range, strength, and function. He is non weight bearing for at least 2 more weeks. He is currently using crutches. He had some firing of the quad today and good extension. We will continue to progress motion and quad strength until he can begin weight bearing.    Personal Factors and Comorbidities Comorbidity 1    Comorbidities right ankle fx 2019    Examination-Activity Limitations Bend;Carry;Stairs;Squat;Locomotion Level;Transfers    Examination-Participation Restrictions Cleaning;Community Activity;Shop;Medication Management    Stability/Clinical Decision Making Stable/Uncomplicated    Clinical Decision Making Low    Rehab Potential Excellent    PT Frequency 2x / week    PT Duration 8 weeks    PT Treatment/Interventions Electrical Stimulation;ADLs/Self Care Home Management;Cryotherapy;Gait training;Stair training;Functional mobility training;Therapeutic activities;Therapeutic exercise;Balance training;Manual techniques;Passive range of motion;Taping    PT Next Visit Plan Follow SOS protocol. Per MD PROM quad strenthening AROM, Start heel slides, review quad sets, PROM into flexion, consider russian if the quad wont fire.  consdier heel slides, consider assisted SLR if tolerated. No open chain at this time. Progress to closed chain strengthening when able.    PT Home Exercise Plan Access Code: [email or web address]: [email or web address]: 11/19/2021Prepared by: Alric Seton.Supine Quad Set - 5 x daily - 7 x weekly - 2 sets - 10 reps - 5 hold.Supine Heel Slide with Strap - 3 x daily - 7 x weekly - 1 sets - 5 reps - 15sec hold.Supine Knee Extension Stretch on Towel Roll - 1 x daily - 7 x weekly - 1 sets - 3 reps - 15 hold    Consulted and Agree with Plan of Care Patient           Patient will benefit from skilled therapeutic intervention in order to improve the following deficits and impairments:     Visit Diagnosis: Stiffness of right knee, not elsewhere classified - Plan: PT plan of care cert/re-cert  Acute pain of right knee - Plan: PT plan of care cert/re-cert  Other abnormalities of gait and mobility - Plan: PT plan of care cert/re-cert  Localized edema - Plan: PT plan of care cert/re-cert     Problem List There are  no problems to display for this patient.   Dessie Comaavid J Albin Duckett PT DPT 10/17/2020, 12:01 PM  [facility] [address] [place], [place], [address] Phone: [phone]   Fax:  [phone]  Name: Corinne PortsRamel Werst MRN: [id number] Date of Birth: Jun 03, 1981

## 2020-10-17 NOTE — Patient Instructions (Signed)
Access Code: [email or web address]: [email or web address]: 11/19/2021Prepared by: Onalee Hua CarrollExercises  Supine Quad Set - 5 x daily - 7 x weekly - 2 sets - 10 reps - 5 hold  Supine Heel Slide with Strap - 3 x daily - 7 x weekly - 1 sets - 5 reps - 15sec hold  Supine Knee Extension Stretch on Towel Roll - 1 x daily - 7 x weekly - 1 sets - 3 reps - 15 hold

## 2020-10-21 ENCOUNTER — Telehealth: Payer: Self-pay | Admitting: Orthopedic Surgery

## 2020-10-21 ENCOUNTER — Ambulatory Visit: Payer: Medicaid Other

## 2020-10-21 ENCOUNTER — Other Ambulatory Visit: Payer: Self-pay

## 2020-10-21 DIAGNOSIS — M25661 Stiffness of right knee, not elsewhere classified: Secondary | ICD-10-CM

## 2020-10-21 DIAGNOSIS — R2689 Other abnormalities of gait and mobility: Secondary | ICD-10-CM

## 2020-10-21 DIAGNOSIS — M25561 Pain in right knee: Secondary | ICD-10-CM

## 2020-10-21 DIAGNOSIS — R600 Localized edema: Secondary | ICD-10-CM

## 2020-10-21 NOTE — Telephone Encounter (Signed)
[employer] forms received. Sent to [employer]. 

## 2020-10-21 NOTE — Therapy (Signed)
[facility] [address] [place], [place], [address] Phone: [phone]   Fax:  [phone]  Physical Therapy Treatment  Patient Details  Name: Lance Collins MRN: [id number] Date of Birth: 05-03-1981 Referring Provider (PT): Dr Cammy Copa    Encounter Date: 10/21/2020   PT End of Session - 10/21/20 1050    Visit Number 2    Number of Visits 16    Date for PT Re-Evaluation 12/11/20    Authorization Type [employer]    PT Start Time 1050    PT Stop Time 1135    PT Time Calculation (min) 45 min    Activity Tolerance Patient tolerated treatment well;Patient limited by pain    Behavior During Therapy [facility] for tasks assessed/performed           Past Medical History:  Diagnosis Date  . Fracture    right ankle  . Inguinal hernia     Past Surgical History:  Procedure Laterality Date  . ANTERIOR CRUCIATE LIGAMENT REPAIR Right 10/02/2020   Procedure: right knee anterior cruciate ligament reconstruction quad autograft, meniscal root repair, medial collateral ligament repair;  Surgeon: Cammy Copa, MD;  [facility]: [facility] OR;  Service: Orthopedics;  Laterality: Right;  . MULTIPLE TOOTH EXTRACTIONS    . ORIF ANKLE FRACTURE Right 05/11/2018   Procedure: OPEN REDUCTION INTERNAL FIXATION (ORIF) ANKLE FRACTURE AND SYNDESMOSIS INJURY;  Surgeon: Myrene Galas, MD;  [facility]: [facility] OR;  Service: Orthopedics;  Laterality: Right;    There were no vitals filed for this visit.   Subjective Assessment - 10/21/20 1053    Subjective No changes.   He did HEP daily.    Currently in Pain? Yes    Pain Score 5     Pain Location Knee    Pain Orientation Right    Pain Descriptors / Indicators Aching    Pain Type Surgical pain    Pain Onset 1 to 4 weeks ago    Pain Frequency Intermittent    Aggravating Factors  moving leg.  meds , cold                             [facility] Adult PT Treatment/Exercise - 10/21/20 0001       Knee/Hip Exercises: Stretches   Other Knee/Hip Stretches knee extension      Other Knee/Hip Stretches knee flexion stretch off table with support from PT .       Knee/Hip Exercises: Supine   Quad Sets Limitations 20 reps with verbal and tactile cues.     Straight Leg Raises Right;AAROM;10 reps;2 sets    Straight Leg Raises Limitations quad lag but he was able to lift with less assist by end of set.  then AROM on second set.       Modalities   Modalities Vasopneumatic      Vasopneumatic   Number Minutes Vasopneumatic  15 minutes    Vasopnuematic Location  Knee    Vasopneumatic Pressure Medium    Vasopneumatic Temperature  34 deg          Worked on HEP with multiple reps and assistance as needed for  Heel slides  Knee ROM,         PT Education - 10/21/20 1123    Education Details importance of managing swelling with cold and elevation   SLR RT 2x10 2-3x/day    Person(s) Educated Patient    Methods Explanation;Tactile cues;Verbal cues;Handout  Comprehension Verbalized understanding;Returned demonstration            PT Short Term Goals - 10/17/20 1140      PT SHORT TERM GOAL #1   Title Patient will increase passive knee flexion to 100 degrees without pain    Baseline 80    Time 3    Period Weeks    Status New    Target Date 11/07/20      PT SHORT TERM GOAL #2   Title Patient will perfrom SLR without assistance    Baseline can not perfrom    Time 3    Period Weeks    Status New    Target Date 11/07/20      PT SHORT TERM GOAL #3   Title Patient will demonstrate full extension without pain    Baseline -7    Time 3    Period Weeks    Status New    Target Date 06/12/21             PT Long Term Goals - 10/17/20 1141      PT LONG TERM GOAL #1   Title Patient will be indepdnent with HEP    Baseline does not have HEP    Time 6    Period Weeks    Status New    Target Date 11/28/20      PT LONG TERM GOAL #2   Title Patient will ambulate 3000'  withoiut AD with no sign of knee buckling    Baseline using crutches. NWB at this time    Time 6    Period Weeks    Status New    Target Date 11/28/20                 Plan - 10/21/20 1051    Clinical Impression Statement Improved SLR control . Stiffness ans swelling limiting ROm and quad activity. We will work on quad activation more.  Add side and prone SLR    PT Treatment/Interventions Electrical Stimulation;ADLs/Self Care Home Management;Cryotherapy;Gait training;Stair training;Functional mobility training;Therapeutic activities;Therapeutic exercise;Balance training;Manual techniques;Passive range of motion;Taping    PT Next Visit Plan Follow SOS protocol. Per MD PROM quad strenthening AROM, Start heel slides, review quad sets, PROM into flexion, consider russian if the quad wont fire. consdier heel slides, consider assisted SLR if tolerated. No open chain at this time. Progress to closed chain strengthening when able.    PT Home Exercise Plan Access Code: [email or web address]: [email or web address]: 11/19/2021Prepared by: Alric Seton.Supine Quad Set - 5 x daily - 7 x weekly - 2 sets - 10 reps - 5 hold.Supine Heel Slide with Strap - 3 x daily - 7 x weekly - 1 sets - 5 reps - 15sec hold.Supine Knee Extension Stretch on Towel Roll - 1 x daily - 7 x weekly - 1 sets - 3 reps - 15 hold,   SLR x 10-20 2x/day RT    Consulted and Agree with Plan of Care Patient           Patient will benefit from skilled therapeutic intervention in order to improve the following deficits and impairments:  Pain, Decreased range of motion, Difficulty walking, Decreased activity tolerance, Decreased strength, Increased muscle spasms  Visit Diagnosis: Stiffness of right knee, not elsewhere classified  Acute pain of right knee  Other abnormalities of gait and mobility  Localized edema     Problem List There are no problems to display for this patient.   Caprice Red  PT 10/21/2020, 11:26 AM  [facility] [address] [place], [place], [address] Phone: [phone]   Fax:  [phone]  Name: Lance Collins MRN: [id number] Date of Birth: 22-Jan-1981

## 2020-10-22 ENCOUNTER — Ambulatory Visit (INDEPENDENT_AMBULATORY_CARE_PROVIDER_SITE_OTHER): Payer: Medicaid Other | Admitting: Orthopedic Surgery

## 2020-10-22 ENCOUNTER — Telehealth: Payer: Self-pay

## 2020-10-22 DIAGNOSIS — S83511A Sprain of anterior cruciate ligament of right knee, initial encounter: Secondary | ICD-10-CM

## 2020-10-22 MED ORDER — METHOCARBAMOL 500 MG PO TABS
500.0000 mg | ORAL_TABLET | Freq: Three times a day (TID) | ORAL | 0 refills | Status: DC | PRN
Start: 2020-10-22 — End: 2021-11-26

## 2020-10-22 NOTE — Telephone Encounter (Signed)
Doppler r/o DVT scheduled for Friday at 9am Patient aware.

## 2020-10-24 ENCOUNTER — Other Ambulatory Visit: Payer: Self-pay

## 2020-10-24 ENCOUNTER — Ambulatory Visit (HOSPITAL_COMMUNITY)
Admission: RE | Admit: 2020-10-24 | Discharge: 2020-10-24 | Disposition: A | Discharge status: Home / Self Care | Payer: Medicaid Other | Source: Ambulatory Visit | Attending: Orthopedic Surgery | Admitting: Orthopedic Surgery

## 2020-10-24 DIAGNOSIS — S83511A Sprain of anterior cruciate ligament of right knee, initial encounter: Secondary | ICD-10-CM | POA: Diagnosis present

## 2020-10-24 NOTE — Progress Notes (Signed)
Lower extremity venous has been completed.   Preliminary results in CV Proc.   Blanch Media 10/24/2020 9:25 AM

## 2020-10-26 ENCOUNTER — Encounter: Payer: Self-pay | Admitting: Orthopedic Surgery

## 2020-10-26 NOTE — Progress Notes (Signed)
   Post-Op Visit Note   Patient: Lance Collins           Date of Birth: 1981/03/13           MRN: [id number] Visit Date: 10/22/2020 PCP: [facility]   Assessment & Plan:  Chief Complaint:  Chief Complaint  Patient presents with  . Right Knee - Routine Post Op   Visit Diagnoses:  1. Rupture of anterior cruciate ligament of right knee, initial encounter     Plan: Patient presents now 3 weeks out right knee ACL reconstruction with quad autograft as well as anterior meniscal root repair and MCL repair.  Still is having some pain and stiffness.  Having a little bit of right leg swelling.  Ultrasound negative at the time of this dictation for DVT.  Okay to be partial weightbearing after December 4.  Refill Robaxin 500 mg p.o. every 8 hours as needed spasm.  Okay to flex beyond 90 degrees but no loadbearing beyond 90 degrees of flexion.  3-week return for clinical recheck.  Follow-Up Instructions: Return in about 3 weeks (around 11/12/2020).   Orders:  Orders Placed This Encounter  Procedures  . VAS [place] LOWER EXTREMITY VENOUS (DVT)   Meds ordered this encounter  Medications  . methocarbamol (ROBAXIN) 500 MG tablet    Sig: Take 1 tablet (500 mg total) by mouth every 8 (eight) hours as needed.    Dispense:  30 tablet    Refill:  0    Imaging: No results found.  PMFS History: There are no problems to display for this patient.  Past Medical History:  Diagnosis Date  . Fracture    right ankle  . Inguinal hernia     Family History  Problem Relation Age of Onset  . Crohn's disease Mother     Past Surgical History:  Procedure Laterality Date  . ANTERIOR CRUCIATE LIGAMENT REPAIR Right 10/02/2020   Procedure: right knee anterior cruciate ligament reconstruction quad autograft, meniscal root repair, medial collateral ligament repair;  Surgeon: Cammy Copa, MD;  [facility] OR;  Service: Orthopedics;  Laterality: Right;  . MULTIPLE TOOTH EXTRACTIONS     . ORIF ANKLE FRACTURE Right 05/11/2018   Procedure: OPEN REDUCTION INTERNAL FIXATION (ORIF) ANKLE FRACTURE AND SYNDESMOSIS INJURY;  Surgeon: Myrene Galas, MD;  [facility]: [facility] OR;  Service: Orthopedics;  Laterality: Right;   Social History   Occupational History  . Not on file  Tobacco Use  . Smoking status: Never Smoker  . Smokeless tobacco: Never Used  Vaping Use  . Vaping Use: Never used  Substance and Sexual Activity  . Alcohol use: Yes    Alcohol/week: 2.0 standard drinks    Types: 2 Cans of beer per week  . Drug use: No  . Sexual activity: Not on file

## 2020-10-27 ENCOUNTER — Ambulatory Visit: Payer: Medicaid Other

## 2020-10-27 ENCOUNTER — Other Ambulatory Visit: Payer: Self-pay

## 2020-10-27 DIAGNOSIS — R2689 Other abnormalities of gait and mobility: Secondary | ICD-10-CM

## 2020-10-27 DIAGNOSIS — M25661 Stiffness of right knee, not elsewhere classified: Secondary | ICD-10-CM

## 2020-10-27 DIAGNOSIS — M25561 Pain in right knee: Secondary | ICD-10-CM

## 2020-10-27 DIAGNOSIS — R600 Localized edema: Secondary | ICD-10-CM

## 2020-10-27 NOTE — Therapy (Signed)
[place], [place], [address] Phone: [phone]   Fax:  [phone]  Physical Therapy Treatment  Patient Details  Name: Lance Collins MRN: [id number] Date of Birth: 1981/04/26 Referring Provider (PT): Dr Meredith Pel    Encounter Date: 10/27/2020   PT End of Session - 10/27/20 1000    Visit Number 3    Number of Visits 16    Date for PT Re-Evaluation 12/11/20    Authorization Type [employer] M[employer]    PT Start Time 0915    PT Stop Time 4158    PT Time Calculation (min) 60 min    Behavior During Therapy [facility] for tasks assessed/performed           Past Medical History:  Diagnosis Date   Fracture    right ankle   Inguinal hernia     Past Surgical History:  Procedure Laterality Date   ANTERIOR CRUCIATE LIGAMENT REPAIR Right 10/02/2020   Procedure: right knee anterior cruciate ligament reconstruction quad autograft, meniscal root repair, medial collateral ligament repair;  Surgeon: Meredith Pel, MD;  Location: [place];  Service: Orthopedics;  Laterality: Right;   MULTIPLE TOOTH EXTRACTIONS     ORIF ANKLE FRACTURE Right 05/11/2018   Procedure: OPEN REDUCTION INTERNAL FIXATION (ORIF) ANKLE FRACTURE AND SYNDESMOSIS INJURY;  Surgeon: Altamese Alamo, MD;  Location: [place];  Service: Orthopedics;  Laterality: Right;    There were no vitals filed for this visit.   Subjective Assessment - 10/27/20 0923    Subjective MD said no weight bearing , Need to do exercises and get knee flexible. Cut back on CPM per MD.    Currently in Pain? No/denies              [facility] PT Assessment - 10/27/20 0001      AROM   Right/Left Knee Left    Right Knee Extension -10    Right Knee Flexion 70      PROM   Right/Left Knee Left    Right Knee Flexion 70    Left Knee Extension --                         [facility] Adult PT Treatment/Exercise - 10/27/20 0001      Knee/Hip Exercises: Supine    Straight Leg Raises Right;2 sets;10 reps    Straight Leg Raises Limitations quad lag      Knee/Hip Exercises: Sidelying   Hip ABduction Right;2 sets;10 reps      Knee/Hip Exercises: Prone   Hamstring Curl 2 sets;10 reps    Hip Extension Right;2 sets;10 reps      Modalities   Modalities Vasopneumatic      Vasopneumatic   Number Minutes Vasopneumatic  15 minutes    Vasopnuematic Location  Knee    Vasopneumatic Pressure Medium    Vasopneumatic Temperature  34 deg      Manual Therapy   Manual Therapy Passive ROM;Joint mobilization;Soft tissue mobilization    Joint Mobilization AP gr 2-3 glides tibia     Soft tissue mobilization RT quad    Passive ROM gentle progressive flexion                  PT Education - 10/27/20 0959    Education Details Cut CPM time to 2x/day 30 min then on sofa passive flexion for 15 min proressive flexion 2-3 reps during stretch time . Should have min pain during  15 min    Person(s) Educated Patient    Methods Explanation;Tactile cues;Verbal cues    Comprehension Returned demonstration            PT Short Term Goals - 10/27/20 1039      PT SHORT TERM GOAL #1   Title Patient will increase passive knee flexion to 100 degrees without pain    Baseline 85    Status On-going      PT SHORT TERM GOAL #2   Title Patient will perfrom SLR without assistance    Baseline able but still with lag      PT SHORT TERM GOAL #3   Title Patient will demonstrate full extension without pain    Baseline passive full  , active still -10  min pain    Status Partially Met             PT Long Term Goals - 10/17/20 1141      PT LONG TERM GOAL #1   Title Patient will be indepdnent with HEP    Baseline does not have HEP    Time 6    Period Weeks    Status New    Target Date 11/28/20      PT LONG TERM GOAL #2   Title Patient will ambulate 3000' withoiut AD with no sign of knee buckling    Baseline using crutches. NWB at this time    Time 6     Period Weeks    Status New    Target Date 11/28/20                 Plan - 10/27/20 1027    Clinical Impression Statement No improvement in active or passive ROM.     MD allowing  90 + degrees but no loading . PWB after !01/04/20. Passive flexion to 85 degrees post.  Hope with less CPM will decrease pain and allow increased ROM    PT Treatment/Interventions Electrical Stimulation;ADLs/Self Care Home Management;Cryotherapy;Gait training;Stair training;Functional mobility training;Therapeutic activities;Therapeutic exercise;Balance training;Manual techniques;Passive range of motion;Taping    PT Next Visit Plan > 90 degrees ROM as able ,  PWB starting 11/02/20  facilitate quads , vaso,  manual    PT Home Exercise Plan Access Code: [email or web address]: [email or web address]: 11/19/2021Prepared by: Doyne Keel Set - 5 x daily - 7 x weekly - 2 sets - 10 reps - 5 holdSupine Heel Slide with Strap - 3 x daily - 7 x weekly - 1 sets - 5 reps - 15sec holdSupine Knee Extension Stretch on Towel Roll - 1 x daily - 7 x weekly - 1 sets - 3 reps - 15 hold,   SLR x 10-20 2x/day RT    Consulted and Agree with Plan of Care Patient           Patient will benefit from skilled therapeutic intervention in order to improve the following deficits and impairments:  Pain, Decreased range of motion, Difficulty walking, Decreased activity tolerance, Decreased strength, Increased muscle spasms  Visit Diagnosis: Stiffness of right knee, not elsewhere classified  Acute pain of right knee  Other abnormalities of gait and mobility  Localized edema     Problem List There are no problems to display for this patient.   Darrel Hoover  PT 10/27/2020, 10:42 AM  [facility] [address] [place], [place], [address] Phone: [phone]   Fax:  [phone]  Name: Lance Collins MRN: [id number] Date of Birth: 1981-05-05

## 2020-11-03 ENCOUNTER — Ambulatory Visit: Payer: Medicaid Other

## 2020-11-06 ENCOUNTER — Ambulatory Visit: Payer: Medicaid Other | Attending: Orthopedic Surgery

## 2020-11-06 ENCOUNTER — Other Ambulatory Visit: Payer: Self-pay

## 2020-11-06 DIAGNOSIS — M25561 Pain in right knee: Secondary | ICD-10-CM | POA: Diagnosis present

## 2020-11-06 DIAGNOSIS — R600 Localized edema: Secondary | ICD-10-CM | POA: Insufficient documentation

## 2020-11-06 DIAGNOSIS — R2689 Other abnormalities of gait and mobility: Secondary | ICD-10-CM | POA: Insufficient documentation

## 2020-11-06 DIAGNOSIS — M25661 Stiffness of right knee, not elsewhere classified: Secondary | ICD-10-CM | POA: Insufficient documentation

## 2020-11-06 NOTE — Therapy (Signed)
[place], [place], [address] Phone: [phone]   Fax:  [phone]  Physical Therapy Treatment  Patient Details  Name: Demond Shallenberger MRN: [id number] Date of Birth: 01/27/1981 Referring Provider (PT): Dr Meredith Pel    Encounter Date: 11/06/2020   PT End of Session - 11/06/20 1346    Visit Number 4    Number of Visits 16    Date for PT Re-Evaluation 12/11/20    Authorization Type [employer]    PT Start Time 9675    PT Stop Time 1126    PT Time Calculation (min) 41 min    Activity Tolerance Patient tolerated treatment well;Patient limited by pain    Behavior During Therapy [facility] for tasks assessed/performed           Past Medical History:  Diagnosis Date  . Fracture    right ankle  . Inguinal hernia     Past Surgical History:  Procedure Laterality Date  . ANTERIOR CRUCIATE LIGAMENT REPAIR Right 10/02/2020   Procedure: right knee anterior cruciate ligament reconstruction quad autograft, meniscal root repair, medial collateral ligament repair;  Surgeon: Meredith Pel, MD;  Location: [place];  Service: Orthopedics;  Laterality: Right;  . MULTIPLE TOOTH EXTRACTIONS    . ORIF ANKLE FRACTURE Right 05/11/2018   Procedure: OPEN REDUCTION INTERNAL FIXATION (ORIF) ANKLE FRACTURE AND SYNDESMOSIS INJURY;  Surgeon: Altamese [place], MD;  Location: [place];  Service: Orthopedics;  Laterality: Right;    There were no vitals filed for this visit.   Subjective Assessment - 11/06/20 1047    Subjective Patient reports his crutch got caught on the cord from his ice machine (similar to [facility]) on Monday and it pulled him a little, resulting in him planting his feet down and had very severe pain in R knee. He fell onto his left side while trying to guard his R knee but has had increased pain since then. PT asked if pt has reported fall and his pain to doctor but pt states he has not because he returns for a  follow up appointment next week.    Pertinent History Right ankle Fracture 2019/    How long can you sit comfortably? Stiffens when he sits    How long can you stand comfortably? can stand with crutches    How long can you walk comfortably? limited distances    Diagnostic tests Nothing post-op    Patient Stated Goals Being able to walk and extend his leg    Currently in Pain? Yes    Pain Score 5     Pain Location Knee   R knee and R calf   Pain Orientation Right;Anterior    Pain Descriptors / Indicators Aching    Pain Onset 1 to 4 weeks ago              [facility] PT Assessment - 11/06/20 0001      Assessment   Medical Diagnosis Right ACL repair/ Lateral Meiniscal repair/ MCL stabilization     Referring Provider (PT) Dr Meredith Pel     Onset Date/Surgical Date 10/02/20      AROM   AROM Assessment Site Knee    Right/Left Knee Right    Right Knee Extension -4   immediately after prone lying for 3 min   Right Knee Flexion 70      PROM   PROM Assessment Site Knee    Right/Left Knee Right  Right Knee Extension -4    Right Knee Flexion 70                         [facility] Adult PT Treatment/Exercise - 11/06/20 0001      Self-Care   Self-Care Other Self-Care Comments    Other Self-Care Comments  Patient cleared for PWB beginning 11/02/2020 - PT asked if patient has attempted [facility] prior to today's session. Pt expressed that he wasn't sure what PWB meant after leaving doctor's office, but he has ambulated to the bathroom without crutches and feels like he was "dragging" his R leg. PT demonstrated and explained PWB with 2 crutches to pt and had pt return demonstration as he left therapy gym after session. Pt encouraged to perform prone hanging with light weight to tolerance at R ankle to promote increased R knee EXT. Education regarding use of rolling pin at home for self STM along R quad and gastroc-soleus. Reviewed HEP and discussed use of CPM machine and ice.       Knee/Hip Exercises: Supine   Heel Slides AAROM;Right;5 reps    Heel Slides Limitations 5 sec hold at end range R knee FL    Straight Leg Raises AROM;Strengthening;Right;2 sets;10 reps    Straight Leg Raises Limitations quad lag      Knee/Hip Exercises: Sidelying   Hip ABduction AROM;Strengthening;Right;2 sets;10 reps      Knee/Hip Exercises: Prone   Hamstring Curl 2 sets;10 reps    Hip Extension AROM;Strengthening;Right;2 sets;10 reps    Other Prone Exercises Prone lying with 2# ankle weight (ankle hanging off edge of mat)      Manual Therapy   Manual Therapy Passive ROM;Joint mobilization;Soft tissue mobilization    Joint Mobilization Gentle grades I-III of tibia on femur to facilitate improved knee FL and EXT    Soft tissue mobilization Roller and STM along R quad and gastroc-soleus                  PT Education - 11/06/20 1341    Education Details Patient cleared for PWB beginning 11/02/2020 - PT asked if patient has attempted [facility] prior to today's session. Pt expressed that he wasn't sure what PWB meant after leaving doctor's office, but he has ambulated to the bathroom without crutches and feels like he was "dragging" his R leg. PT demonstrated and explained PWB with 2 crutches to pt and had pt return demonstration as he left therapy gym after session. Pt encouraged to perform prone hanging with light weight to tolerance at R ankle to promote increased R knee EXT. Education regarding use of rolling pin at home for self STM along R quad and gastroc-soleus. Reviewed HEP and discussed use of CPM machine and ice. Discussed importance of increased ROM to allow for ideal gait pattern once he is cleared for FWB.    Person(s) Educated Patient    Methods Explanation;Demonstration;Tactile cues;Verbal cues    Comprehension Verbalized understanding;Returned demonstration;Verbal cues required;Tactile cues required;Need further instruction            PT Short Term Goals - 11/06/20 1354       PT SHORT TERM GOAL #1   Title Patient will increase passive knee flexion to 100 degrees without pain    Baseline 85    Status On-going      PT SHORT TERM GOAL #2   Title Patient will perfrom SLR without assistance    Baseline able but still with lag  Status Partially Met      PT SHORT TERM GOAL #3   Title Patient will demonstrate full extension without pain    Baseline passive full  , active still -10  min pain    Status Partially Met             PT Long Term Goals - 10/17/20 1141      PT LONG TERM GOAL #1   Title Patient will be indepdnent with HEP    Baseline does not have HEP    Time 6    Period Weeks    Status New    Target Date 11/28/20      PT LONG TERM GOAL #2   Title Patient will ambulate 3000' withoiut AD with no sign of knee buckling    Baseline using crutches. NWB at this time    Time 6    Period Weeks    Status New    Target Date 11/28/20                 Plan - 11/06/20 1346    Clinical Impression Statement Patient's R knee FL remains unchanged this session but he demonstrated minimal improvement in R knee EXT immediately following prone hang. Pt was able to return demonstration of 2 point gait pattern using 2 crutches for PWB at end of session. He reports it feels "okay" during ambulation. Reviewed that pt should not load RLE at/past 90 degrees but can perform motion beyond 90 degree FL if able. He demonstrates continued quad lag during SLR. Tactile cues provided for activation during quad sets. Pt encouraged to consistently perform HEP. Pt denied VASO/cryotherapy at end of session today stating he plans to apply ice/compression when he returns home.    Personal Factors and Comorbidities Comorbidity 1    Comorbidities right ankle fx 2019    Examination-Activity Limitations Bend;Carry;Stairs;Squat;Locomotion Level;Transfers    Examination-Participation Restrictions Cleaning;Community Activity;Shop;Medication Management    Rehab Potential  Excellent    PT Frequency 2x / week    PT Duration 8 weeks    PT Treatment/Interventions Electrical Stimulation;ADLs/Self Care Home Management;Cryotherapy;Gait training;Stair training;Functional mobility training;Therapeutic activities;Therapeutic exercise;Balance training;Manual techniques;Passive range of motion;Taping    PT Next Visit Plan > 90 degrees ROM as able , gait training with 2 crutches PWB as tolerated,  facilitate quads , vaso,  manual    PT Home Exercise Plan Access Code: [email or web address]: [email or web address]: 11/19/2021Prepared by: Oren Bracket.Supine Quad Set - 5 x daily - 7 x weekly - 2 sets - 10 reps - 5 hold.Supine Heel Slide with Strap - 3 x daily - 7 x weekly - 1 sets - 5 reps - 15sec hold.Supine Knee Extension Stretch on Towel Roll - 1 x daily - 7 x weekly - 1 sets - 3 reps - 15 hold,   SLR x 10-20 2x/day RT    Consulted and Agree with Plan of Care Patient           Patient will benefit from skilled therapeutic intervention in order to improve the following deficits and impairments:  Pain,Decreased range of motion,Difficulty walking,Decreased activity tolerance,Decreased strength,Increased muscle spasms  Visit Diagnosis: Stiffness of right knee, not elsewhere classified  Acute pain of right knee  Other abnormalities of gait and mobility  Localized edema     Problem List There are no problems to display for this patient.    Haydee Monica, PT, DPT 11/06/20 1:56 PM  [place], [place],  [address] Phone: [phone]   Fax:  [phone]  Name: Zaccai Chavarin MRN: [id number] Date of Birth: 05-29-1981

## 2020-11-07 ENCOUNTER — Other Ambulatory Visit: Payer: Self-pay

## 2020-11-07 MED ORDER — OXYCODONE-ACETAMINOPHEN 5-325 MG PO TABS: 1.0000 | ORAL_TABLET | Freq: Two times a day (BID) | ORAL | 0 refills | Status: DC | PRN

## 2020-11-11 ENCOUNTER — Ambulatory Visit: Payer: Medicaid Other | Admitting: Physical Therapy

## 2020-11-12 ENCOUNTER — Ambulatory Visit (INDEPENDENT_AMBULATORY_CARE_PROVIDER_SITE_OTHER): Payer: Medicaid Other | Admitting: Orthopedic Surgery

## 2020-11-12 ENCOUNTER — Telehealth: Payer: Self-pay

## 2020-11-12 ENCOUNTER — Other Ambulatory Visit: Payer: Self-pay

## 2020-11-12 ENCOUNTER — Ambulatory Visit (INDEPENDENT_AMBULATORY_CARE_PROVIDER_SITE_OTHER): Payer: Medicaid Other

## 2020-11-12 DIAGNOSIS — M25561 Pain in right knee: Secondary | ICD-10-CM

## 2020-11-12 MED ORDER — METHOCARBAMOL 500 MG PO TABS: 500.0000 mg | ORAL_TABLET | Freq: Three times a day (TID) | ORAL | 0 refills | Status: DC | PRN

## 2020-11-12 MED ORDER — MELOXICAM 15 MG PO TABS: ORAL_TABLET | ORAL | 0 refills | Status: DC

## 2020-11-12 NOTE — Telephone Encounter (Signed)
meds sent to pharmacy

## 2020-11-13 ENCOUNTER — Encounter: Payer: Self-pay | Admitting: Physical Therapy

## 2020-11-13 ENCOUNTER — Other Ambulatory Visit: Payer: Self-pay

## 2020-11-13 ENCOUNTER — Ambulatory Visit: Payer: Medicaid Other | Admitting: Physical Therapy

## 2020-11-13 DIAGNOSIS — M25661 Stiffness of right knee, not elsewhere classified: Secondary | ICD-10-CM | POA: Diagnosis not present

## 2020-11-13 DIAGNOSIS — M25561 Pain in right knee: Secondary | ICD-10-CM

## 2020-11-13 DIAGNOSIS — R600 Localized edema: Secondary | ICD-10-CM

## 2020-11-13 DIAGNOSIS — R2689 Other abnormalities of gait and mobility: Secondary | ICD-10-CM

## 2020-11-14 ENCOUNTER — Encounter: Payer: Self-pay | Admitting: Physical Therapy

## 2020-11-14 NOTE — Therapy (Signed)
[place], [place], [address] Phone: [phone]   Fax:  [phone]  Physical Therapy Treatment  Patient Details  Name: Lance Collins MRN: [id number] Date of Birth: 02-Sep-1981 Referring Provider (PT): Dr Meredith Pel    Encounter Date: 11/13/2020   PT End of Session - 11/13/20 1022    Visit Number 5    Number of Visits 16    Date for PT Re-Evaluation 12/11/20    Authorization Type [employer]    PT Start Time [phone]    PT Stop Time 1031    PT Time Calculation (min) 53 min    Activity Tolerance Patient tolerated treatment well;Patient limited by pain    Behavior During Therapy [facility] for tasks assessed/performed           Past Medical History:  Diagnosis Date  . Fracture    right ankle  . Inguinal hernia     Past Surgical History:  Procedure Laterality Date  . ANTERIOR CRUCIATE LIGAMENT REPAIR Right 10/02/2020   Procedure: right knee anterior cruciate ligament reconstruction quad autograft, meniscal root repair, medial collateral ligament repair;  Surgeon: Meredith Pel, MD;  Location: [place];  Service: Orthopedics;  Laterality: Right;  . MULTIPLE TOOTH EXTRACTIONS    . ORIF ANKLE FRACTURE Right 05/11/2018   Procedure: OPEN REDUCTION INTERNAL FIXATION (ORIF) ANKLE FRACTURE AND SYNDESMOSIS INJURY;  Surgeon: Altamese [place], MD;  Location: [place];  Service: Orthopedics;  Laterality: Right;    There were no vitals filed for this visit.   Subjective Assessment - 11/13/20 0945    Subjective Per MD continue non-weight bearing until January 1st. He gfave him some anti-inflammatory pills to help work on his ROM.    Pertinent History Right ankle Fracture 2019/    How long can you sit comfortably? Stiffens when he sits    How long can you stand comfortably? can stand with crutches    How long can you walk comfortably? limited distances    Diagnostic tests Nothing post-op    Patient Stated  Goals Being able to walk and extend his leg    Currently in Pain? Yes    Pain Score 4     Pain Location Knee    Pain Orientation Right;Anterior    Pain Descriptors / Indicators Aching    Pain Type Surgical pain    Pain Onset 1 to 4 weeks ago    Pain Frequency Intermittent    Aggravating Factors  moving the leg    Pain Relieving Factors rest, pain medications    Effect of Pain on Daily Activities difficulty perfroming ADL's                             [facility] Adult PT Treatment/Exercise - 11/14/20 0001      Knee/Hip Exercises: Supine   Quad Sets Limitations 20x 5 sec hold    Straight Leg Raises AROM;Strengthening;Right;2 sets;10 reps    Straight Leg Raises Limitations 2x10 quad lag noted    Other Supine Knee/Hip Exercises self heel slide and stretch x10 5 sec hold      Knee/Hip Exercises: Sidelying   Hip ABduction AROM;Strengthening;Right;2 sets;10 reps      Vasopneumatic   Number Minutes Vasopneumatic  15 minutes    Vasopnuematic Location  Knee    Vasopneumatic Pressure Medium    Vasopneumatic Temperature  34 deg      Manual Therapy  Manual Therapy Joint mobilization    Joint Mobilization patella mobilization    Soft tissue mobilization IASTMY to quad    Passive ROM into flexion and extension                  PT Education - 11/13/20 1021    Education Details reviewed the importance of stretching with a towel or dsheet and not relying on CPM    Person(s) Educated Patient    Methods Explanation;Demonstration;Tactile cues;Verbal cues    Comprehension Verbalized understanding;Returned demonstration;Verbal cues required;Tactile cues required            PT Short Term Goals - 11/06/20 1354      PT SHORT TERM GOAL #1   Title Patient will increase passive knee flexion to 100 degrees without pain    Baseline 85    Status On-going      PT SHORT TERM GOAL #2   Title Patient will perfrom SLR without assistance    Baseline able but still with lag     Status Partially Met      PT SHORT TERM GOAL #3   Title Patient will demonstrate full extension without pain    Baseline passive full  , active still -10  min pain    Status Partially Met             PT Long Term Goals - 10/17/20 1141      PT LONG TERM GOAL #1   Title Patient will be indepdnent with HEP    Baseline does not have HEP    Time 6    Period Weeks    Status New    Target Date 11/28/20      PT LONG TERM GOAL #2   Title Patient will ambulate 3000' withoiut AD with no sign of knee buckling    Baseline using crutches. NWB at this time    Time 6    Period Weeks    Status New    Target Date 11/28/20                 Plan - 11/14/20 1153    Clinical Impression Statement Patient's passive flexion improved. He continues to be limited. Therapy reviewed at home how to stretch. He is using his CPM to stretch. he was advised not to use CPM for stretching and to acutlaly stretch it. He is non-weight bearing until after the new year. We will continue to perform hip and quad strengthening in non-weight bearing positions. He is concerned about quad atrophy. he was advised to continue with SLR and quad sets and that should improve when he can weight bear.    Personal Factors and Comorbidities Comorbidity 1    Comorbidities right ankle fx 2019    Examination-Activity Limitations Bend;Carry;Stairs;Squat;Locomotion Level;Transfers    Examination-Participation Restrictions Cleaning;Community Activity;Shop;Medication Management    Stability/Clinical Decision Making Stable/Uncomplicated    Clinical Decision Making Low    Rehab Potential Excellent    PT Frequency 2x / week    PT Duration 8 weeks    PT Treatment/Interventions Electrical Stimulation;ADLs/Self Care Home Management;Cryotherapy;Gait training;Stair training;Functional mobility training;Therapeutic activities;Therapeutic exercise;Balance training;Manual techniques;Passive range of motion;Taping    PT Next Visit Plan >  90 degrees ROM as able , gait training with 2 crutches PWB as tolerated,  facilitate quads , vaso,  manual    PT Home Exercise Plan Access Code: [email or web address]: [email or web address]: 11/19/2021Prepared by: Oren Bracket.Supine Quad Set - 5 x daily - 7 x weekly - 2  sets - 10 reps - 5 hold.Supine Heel Slide with Strap - 3 x daily - 7 x weekly - 1 sets - 5 reps - 15sec hold.Supine Knee Extension Stretch on Towel Roll - 1 x daily - 7 x weekly - 1 sets - 3 reps - 15 hold,   SLR x 10-20 2x/day RT    Consulted and Agree with Plan of Care Patient           Patient will benefit from skilled therapeutic intervention in order to improve the following deficits and impairments:  Pain,Decreased range of motion,Difficulty walking,Decreased activity tolerance,Decreased strength,Increased muscle spasms  Visit Diagnosis: Stiffness of right knee, not elsewhere classified  Acute pain of right knee  Other abnormalities of gait and mobility  Localized edema     Problem List There are no problems to display for this patient.   Carney Living PT DPT 11/14/2020, 11:57 AM  [facility] [address] [place], [place], [address] Phone: [phone]   Fax:  [phone]  Name: Lance Collins MRN: [id number] Date of Birth: 05-16-1981

## 2020-11-16 ENCOUNTER — Encounter: Payer: Self-pay | Admitting: Orthopedic Surgery

## 2020-11-16 MED ORDER — TRAMADOL HCL 50 MG PO TABS
50.0000 mg | ORAL_TABLET | Freq: Four times a day (QID) | ORAL | 0 refills | Status: DC | PRN
Start: 2020-11-16 — End: 2020-12-12

## 2020-11-16 NOTE — Progress Notes (Signed)
   Post-Op Visit Note   Patient: Lance Collins           Date of Birth: [birth year]           MRN: [id number] Visit Date: 11/12/2020 PCP: [facility]   Assessment & Plan:  Chief Complaint:  Chief Complaint  Patient presents with  . Right Knee - Routine Post Op   Visit Diagnoses:  1. Right knee pain, unspecified chronicity     Plan: Patient presents now status post right knee ACL reconstruction with quad as well as meniscal root repair and MCL repair on the right knee.  Had a fall Sunday.  He has been partial weightbearing with crutches.  Radiographs of the knee today look good.  MCL remained stable.  ACL remained stable.  He can do about 20 straight leg raises.  Plan is to let him weight-bear as tolerated on 11/29/2020.  Out of work for 4 weeks note provided.  Negative Homans no calf tenderness today.  Robaxin Ultram Mobic refilled.  Follow-Up Instructions: No follow-ups on file.   Orders:  Orders Placed This Encounter  Procedures  . XR Knee 1-2 Views Right   Meds ordered this encounter  Medications  . meloxicam (MOBIC) 15 MG tablet    Sig: 1 po q d prn    Dispense:  30 tablet    Refill:  0  . methocarbamol (ROBAXIN) 500 MG tablet    Sig: Take 1 tablet (500 mg total) by mouth every 8 (eight) hours as needed for muscle spasms.    Dispense:  30 tablet    Refill:  0    Imaging: No results found.  PMFS History: There are no problems to display for this patient.  Past Medical History:  Diagnosis Date  . Fracture    right ankle  . Inguinal hernia     Family History  Problem Relation Age of Onset  . Crohn's disease Mother     Past Surgical History:  Procedure Laterality Date  . ANTERIOR CRUCIATE LIGAMENT REPAIR Right 10/02/2020   Procedure: right knee anterior cruciate ligament reconstruction quad autograft, meniscal root repair, medial collateral ligament repair;  Surgeon: Cammy Copa, MD;  [facility];  Service: Orthopedics;   Laterality: Right;  . MULTIPLE TOOTH EXTRACTIONS    . ORIF ANKLE FRACTURE Right 05/11/2018   Procedure: OPEN REDUCTION INTERNAL FIXATION (ORIF) ANKLE FRACTURE AND SYNDESMOSIS INJURY;  Surgeon: Myrene Galas, MD;  Location: [facility];  Service: Orthopedics;  Laterality: Right;   Social History   Occupational History  . Not on file  Tobacco Use  . Smoking status: Never Smoker  . Smokeless tobacco: Never Used  Vaping Use  . Vaping Use: Never used  Substance and Sexual Activity  . Alcohol use: Yes    Alcohol/week: 2.0 standard drinks    Types: 2 Cans of beer per week  . Drug use: No  . Sexual activity: Not on file

## 2020-11-18 ENCOUNTER — Encounter: Payer: Self-pay | Admitting: Physical Therapy

## 2020-11-18 ENCOUNTER — Ambulatory Visit: Payer: Medicaid Other | Admitting: Physical Therapy

## 2020-11-18 ENCOUNTER — Other Ambulatory Visit: Payer: Self-pay

## 2020-11-18 DIAGNOSIS — R2689 Other abnormalities of gait and mobility: Secondary | ICD-10-CM

## 2020-11-18 DIAGNOSIS — M25661 Stiffness of right knee, not elsewhere classified: Secondary | ICD-10-CM | POA: Diagnosis not present

## 2020-11-18 DIAGNOSIS — R600 Localized edema: Secondary | ICD-10-CM

## 2020-11-18 DIAGNOSIS — M25561 Pain in right knee: Secondary | ICD-10-CM

## 2020-11-18 NOTE — Therapy (Signed)
[place], [place], [address] Phone: [phone]   Fax:  [phone]  Physical Therapy Treatment  Patient Details  Name: Lance Collins MRN: [id number] Date of Birth: 04/28/1981 Referring Provider (PT): Dr Meredith Pel    Encounter Date: 11/18/2020   PT End of Session - 11/18/20 1325    Visit Number 6    Number of Visits 16    Date for PT Re-Evaluation 12/11/20    Authorization Type [employer]    PT Start Time 0930    PT Stop Time 1013    PT Time Calculation (min) 43 min    Activity Tolerance Patient tolerated treatment well;Patient limited by pain    Behavior During Therapy [facility] for tasks assessed/performed           Past Medical History:  Diagnosis Date  . Fracture    right ankle  . Inguinal hernia     Past Surgical History:  Procedure Laterality Date  . ANTERIOR CRUCIATE LIGAMENT REPAIR Right 10/02/2020   Procedure: right knee anterior cruciate ligament reconstruction quad autograft, meniscal root repair, medial collateral ligament repair;  Surgeon: Meredith Pel, MD;  Location: [place];  Service: Orthopedics;  Laterality: Right;  . MULTIPLE TOOTH EXTRACTIONS    . ORIF ANKLE FRACTURE Right 05/11/2018   Procedure: OPEN REDUCTION INTERNAL FIXATION (ORIF) ANKLE FRACTURE AND SYNDESMOSIS INJURY;  Surgeon: Altamese Bloomingdale, MD;  Location: [place];  Service: Orthopedics;  Laterality: Right;    There were no vitals filed for this visit.   Subjective Assessment - 11/18/20 1320    Subjective Patient reports only minor soreness after the last visit. he has been working hard on his stretches and exercises. he has only minor pain today.    Pertinent History Right ankle Fracture 2019/    How long can you sit comfortably? Stiffens when he sits    How long can you stand comfortably? can stand with crutches    How long can you walk comfortably? limited distances    Diagnostic tests Nothing post-op     Patient Stated Goals Being able to walk and extend his leg    Currently in Pain? No/denies    Pain Score 4     Pain Location Knee    Pain Orientation Right;Anterior    Pain Descriptors / Indicators Aching    Pain Type Chronic pain    Pain Radiating Towards can radiate into the calf    Pain Onset 1 to 4 weeks ago    Pain Frequency Constant    Aggravating Factors  movement of the leg    Pain Relieving Factors rest and pain medications    Effect of Pain on Daily Activities difficulty perfroming ADL's              [facility] PT Assessment - 11/18/20 0001      PROM   Right Knee Extension -3    Right Knee Flexion 105                         [facility] Adult PT Treatment/Exercise - 11/18/20 0001      Knee/Hip Exercises: Supine   Quad Sets Limitations 20x 5 sec hold    Straight Leg Raises AROM;Strengthening;Right;2 sets;10 reps    Straight Leg Raises Limitations 3x10 quad lag noted      Knee/Hip Exercises: Sidelying   Hip ABduction AROM;Strengthening;Right;2 sets;10 reps      Knee/Hip Exercises: Prone  Hip Extension 3 sets;10 reps      Manual Therapy   Manual Therapy Joint mobilization    Joint Mobilization patella mobilization    Soft tissue mobilization IASTMY to quad    Passive ROM into flexion and extension                  PT Education - 11/18/20 1323    Education Details reviewed home exercise technique    Person(s) Educated Patient    Methods Explanation;Demonstration;Tactile cues;Verbal cues    Comprehension Verbalized understanding;Returned demonstration;Verbal cues required;Tactile cues required            PT Short Term Goals - 11/06/20 1354      PT SHORT TERM GOAL #1   Title Patient will increase passive knee flexion to 100 degrees without pain    Baseline 85    Status On-going      PT SHORT TERM GOAL #2   Title Patient will perfrom SLR without assistance    Baseline able but still with lag    Status Partially Met      PT SHORT  TERM GOAL #3   Title Patient will demonstrate full extension without pain    Baseline passive full  , active still -10  min pain    Status Partially Met             PT Long Term Goals - 10/17/20 1141      PT LONG TERM GOAL #1   Title Patient will be indepdnent with HEP    Baseline does not have HEP    Time 6    Period Weeks    Status New    Target Date 11/28/20      PT LONG TERM GOAL #2   Title Patient will ambulate 3000' withoiut AD with no sign of knee buckling    Baseline using crutches. NWB at this time    Time 6    Period Weeks    Status New    Target Date 11/28/20                 Plan - 11/18/20 1009    Clinical Impression Statement The patient is making great progress. His range was measured at 3-103 today. he had a signifcant improvement from the last visit. He is working hard on his range at home. he is allowed to weight bear on Jan 1st. We will get him a visit before to review use of 1 crutch and weight shifting. He tolerated ther-ex well. We will progress him next week as tolerated.    Personal Factors and Comorbidities Comorbidity 1    Comorbidities right ankle fx 2019    Examination-Activity Limitations Bend;Carry;Stairs;Squat;Locomotion Level;Transfers    Examination-Participation Restrictions Cleaning;Community Activity;Shop;Medication Management    Stability/Clinical Decision Making Stable/Uncomplicated    Clinical Decision Making Low    Rehab Potential Excellent    PT Frequency 2x / week    PT Duration 8 weeks    PT Treatment/Interventions Electrical Stimulation;ADLs/Self Care Home Management;Cryotherapy;Gait training;Stair training;Functional mobility training;Therapeutic activities;Therapeutic exercise;Balance training;Manual techniques;Passive range of motion;Taping    PT Next Visit Plan > 90 degrees ROM as able , gait training with 2 crutches PWB as tolerated,  facilitate quads , vaso,  manual    PT Home Exercise Plan Access Code: [email or web address]:  [email or web address]: 11/19/2021Prepared by: Oren Bracket.Supine Quad Set - 5 x daily - 7 x weekly - 2 sets - 10 reps - 5 hold.Supine Heel Slide with Strap - 3  x daily - 7 x weekly - 1 sets - 5 reps - 15sec hold.Supine Knee Extension Stretch on Towel Roll - 1 x daily - 7 x weekly - 1 sets - 3 reps - 15 hold,   SLR x 10-20 2x/day RT    Consulted and Agree with Plan of Care Patient           Patient will benefit from skilled therapeutic intervention in order to improve the following deficits and impairments:  Pain,Decreased range of motion,Difficulty walking,Decreased activity tolerance,Decreased strength,Increased muscle spasms  Visit Diagnosis: Stiffness of right knee, not elsewhere classified  Acute pain of right knee  Other abnormalities of gait and mobility  Localized edema     Problem List There are no problems to display for this patient.   Carney Living PT DPT  11/18/2020, 2:25 PM  [facility] [address] [place], [place], [address] Phone: [phone]   Fax:  [phone]  Name: Lance Collins MRN: [id number] Date of Birth: Oct 26, 1981

## 2020-11-20 ENCOUNTER — Ambulatory Visit: Payer: Medicaid Other | Admitting: Physical Therapy

## 2020-11-20 ENCOUNTER — Encounter: Payer: Self-pay | Admitting: Physical Therapy

## 2020-11-20 ENCOUNTER — Other Ambulatory Visit: Payer: Self-pay

## 2020-11-20 DIAGNOSIS — M25661 Stiffness of right knee, not elsewhere classified: Secondary | ICD-10-CM

## 2020-11-20 DIAGNOSIS — R2689 Other abnormalities of gait and mobility: Secondary | ICD-10-CM

## 2020-11-20 DIAGNOSIS — M25561 Pain in right knee: Secondary | ICD-10-CM

## 2020-11-20 DIAGNOSIS — R600 Localized edema: Secondary | ICD-10-CM

## 2020-11-20 NOTE — Therapy (Signed)
[facility] [address] [place], [place], [address] Phone: [phone]   Fax:  [phone]  Physical Therapy Treatment/progress Note   Patient Details  Name: Lance Collins MRN: [id number] Date of Birth: December 07, 1980 Referring Provider (PT): Dr Cammy Copa    Encounter Date: 11/20/2020   PT End of Session - 11/20/20 0916    Visit Number 7    Number of Visits 16    Date for PT Re-Evaluation 12/11/20    Authorization Type [employer]    PT Start Time [phone]    PT Stop Time 0930    PT Time Calculation (min) 38 min    Activity Tolerance Patient tolerated treatment well;Patient limited by pain    Behavior During Therapy [facility] for tasks assessed/performed           Past Medical History:  Diagnosis Date  . Fracture    right ankle  . Inguinal hernia     Past Surgical History:  Procedure Laterality Date  . ANTERIOR CRUCIATE LIGAMENT REPAIR Right 10/02/2020   Procedure: right knee anterior cruciate ligament reconstruction quad autograft, meniscal root repair, medial collateral ligament repair;  Surgeon: Cammy Copa, MD;  [facility];  Service: Orthopedics;  Laterality: Right;  . MULTIPLE TOOTH EXTRACTIONS    . ORIF ANKLE FRACTURE Right 05/11/2018   Procedure: OPEN REDUCTION INTERNAL FIXATION (ORIF) ANKLE FRACTURE AND SYNDESMOSIS INJURY;  Surgeon: Myrene Galas, MD;  [facility];  Service: Orthopedics;  Laterality: Right;    There were no vitals filed for this visit.   Subjective Assessment - 11/20/20 0853    Subjective Patient droive to [place] to pick up his son. He reports the knee got stiff and sore after the ride.    Pertinent History Right ankle Fracture 2019/    How long can you sit comfortably? Stiffens when he sits    How long can you stand comfortably? can stand with crutches    How long can you walk comfortably? limited distances    Diagnostic tests Nothing post-op    Patient Stated Goals  Being able to walk and extend his leg    Currently in Pain? No/denies              [facility] PT Assessment - 11/20/20 0001      PROM   Right Knee Extension 0    Right Knee Flexion 105                                 PT Education - 11/20/20 0915    Education Details HEP and symptom mangement    Person(s) Educated Patient    Methods Demonstration;Tactile cues;Verbal cues;Explanation            PT Short Term Goals - 11/20/20 0923      PT SHORT TERM GOAL #1   Title Patient will increase passive knee flexion to full    Baseline inital goal to 100 degrees reach goal updated to full flexion    Status Revised    Target Date 01/01/21      PT SHORT TERM GOAL #2   Title Patient will perfrom SLR without assistance    Baseline Perfroming full SLR    Time 3    Period Weeks    Status Achieved      PT SHORT TERM GOAL #3   Title Patient will demonstrate full extension without pain  Baseline dull extension    Time 3    Period Weeks    Status Achieved      PT SHORT TERM GOAL #4   Title Patient will ambualte 300' without a crutch without pain    Time 3    Period Weeks    Status New    Target Date 12/11/20      PT SHORT TERM GOAL #5   Title Patient wil perfrom closed chain weight brearing exercises without pain    Time 3    Period Weeks    Status New    Target Date 12/11/20             PT Long Term Goals - 11/20/20 0925      PT LONG TERM GOAL #1   Title Patient will be indepdnent with HEP including standing weight bearing exercises    Baseline ntaial HEP achieved. GZaol updated for standing vclosed chain exercises.    Time 6    Status Revised    Target Date 01/01/21      PT LONG TERM GOAL #2   Title Patient will ambulate 3000' withoiut AD with no sign of knee buckling    Baseline using crutches. NWB at this time    Time 6    Period Weeks    Status On-going    Target Date 01/01/21                 Plan - 11/20/20 9509     Clinical Impression Statement Patient is making good progress. Per MD he is still non-weight bearing until January 1st. At ythat time we will progress gait training. He has progreeviley improved quad strength and firting at this time. We were able to add weight to SLR today. His range has improved to 0-112 total arc. He would benefit from further skilled therapy 2W6 to continue to advance standing weight bearing exercises.See below for goal specific progress.    Personal Factors and Comorbidities Comorbidity 1    Comorbidities right ankle fx 2019    Examination-Activity Limitations Bend;Carry;Stairs;Squat;Locomotion Level;Transfers    Examination-Participation Restrictions Cleaning;Community Activity;Shop;Medication Management    Stability/Clinical Decision Making Stable/Uncomplicated    Clinical Decision Making Low    Rehab Potential Excellent    PT Frequency 2x / week    PT Treatment/Interventions Electrical Stimulation;ADLs/Self Care Home Management;Cryotherapy;Gait training;Stair training;Functional mobility training;Therapeutic activities;Therapeutic exercise;Balance training;Manual techniques;Passive range of motion;Taping    PT Next Visit Plan > 90 degrees ROM as able , gait training with 2 crutches PWB as tolerated,  facilitate quads , vaso,  manual    PT Home Exercise Plan Access Code: [email or web address]: [email or web address]: 11/19/2021Prepared by: Alric Seton.Supine Quad Set - 5 x daily - 7 x weekly - 2 sets - 10 reps - 5 hold.Supine Heel Slide with Strap - 3 x daily - 7 x weekly - 1 sets - 5 reps - 15sec hold.Supine Knee Extension Stretch on Towel Roll - 1 x daily - 7 x weekly - 1 sets - 3 reps - 15 hold,   SLR x 10-20 2x/day RT    Consulted and Agree with Plan of Care Patient          Check all possible CPT codes: 98338- Therapeutic Exercise, [phone]- Neuro Re-education, [phone] - Gait Training, 97140 - Manual Therapy, 97530 - Therapeutic Activities, 97535 - Self  Care, 97014 - Electrical stimulation (unattended) and 97016 - Vaso          Patient will benefit from skilled therapeutic  intervention in order to improve the following deficits and impairments:  Pain,Decreased range of motion,Difficulty walking,Decreased activity tolerance,Decreased strength,Increased muscle spasms  Visit Diagnosis: Stiffness of right knee, not elsewhere classified  Acute pain of right knee  Other abnormalities of gait and mobility  Localized edema     Problem List There are no problems to display for this patient.   Dessie Coma PT DPT  11/20/2020, 9:40 AM  [facility] [address] [place], [place], [address] Phone: [phone]   Fax:  [phone]  Name: Lance Collins MRN: [id number] Date of Birth: Feb 07, 1981

## 2020-11-27 ENCOUNTER — Encounter: Payer: Self-pay | Admitting: Physical Therapy

## 2020-11-27 ENCOUNTER — Ambulatory Visit: Payer: Medicaid Other | Admitting: Physical Therapy

## 2020-11-27 ENCOUNTER — Other Ambulatory Visit: Payer: Self-pay

## 2020-11-27 DIAGNOSIS — R2689 Other abnormalities of gait and mobility: Secondary | ICD-10-CM

## 2020-11-27 DIAGNOSIS — M25661 Stiffness of right knee, not elsewhere classified: Secondary | ICD-10-CM | POA: Diagnosis not present

## 2020-11-27 DIAGNOSIS — M25561 Pain in right knee: Secondary | ICD-10-CM

## 2020-11-27 DIAGNOSIS — R600 Localized edema: Secondary | ICD-10-CM

## 2020-11-27 NOTE — Therapy (Signed)
[facility] [address] [place], [place], [address] Phone: [phone]   Fax:  [phone]  Physical Therapy Treatment  Patient Details  Name: Lance Collins MRN: [id number] Date of Birth: August 07, 1981 Referring Provider (PT): Dr Cammy Copa    Encounter Date: 11/27/2020   PT End of Session - 11/27/20 1512    Visit Number 8    Number of Visits 16    Date for PT Re-Evaluation 01/01/21    Authorization Type [employer]    PT Start Time 1500    PT Stop Time 1543    PT Time Calculation (min) 43 min    Activity Tolerance Patient tolerated treatment well;Patient limited by pain    Behavior During Therapy [facility] for tasks assessed/performed           Past Medical History:  Diagnosis Date  . Fracture    right ankle  . Inguinal hernia     Past Surgical History:  Procedure Laterality Date  . ANTERIOR CRUCIATE LIGAMENT REPAIR Right 10/02/2020   Procedure: right knee anterior cruciate ligament reconstruction quad autograft, meniscal root repair, medial collateral ligament repair;  Surgeon: Cammy Copa, MD;  [facility];  Service: Orthopedics;  Laterality: Right;  . MULTIPLE TOOTH EXTRACTIONS    . ORIF ANKLE FRACTURE Right 05/11/2018   Procedure: OPEN REDUCTION INTERNAL FIXATION (ORIF) ANKLE FRACTURE AND SYNDESMOSIS INJURY;  Surgeon: Myrene Galas, MD;  [facility];  Service: Orthopedics;  Laterality: Right;    There were no vitals filed for this visit.   Subjective Assessment - 11/27/20 1740    Subjective Patient reports his knee gets sore at times but otherwise it is doing welel. He is working hard on his exercises    Pertinent History Right ankle Fracture 2019/    How long can you sit comfortably? Stiffens when he sits    How long can you stand comfortably? can stand with crutches    How long can you walk comfortably? limited distances    Diagnostic tests Nothing post-op    Patient Stated Goals Being  able to walk and extend his leg    Currently in Pain? No/denies                             [facility] Adult PT Treatment/Exercise - 11/27/20 0001      Knee/Hip Exercises: Aerobic   Nustep for motion 5 min L2 UE/LE      Knee/Hip Exercises: Standing   Heel Raises Limitations x20    Knee Flexion Limitations slow march left with graded UE support x15    Other Standing Knee Exercises side stepping in II bars. Adivsed hopw to do at home; walking with 1 hand in II bars. Advised how to do at home at counter. Advised not to do this until he feels steady with the march and the side step      Knee/Hip Exercises: Supine   Straight Leg Raises AROM;Strengthening;Right;2 sets;10 reps    Straight Leg Raises Limitations 3x10 quad lag noted      Knee/Hip Exercises: Sidelying   Hip ABduction AROM;Strengthening;Right;2 sets;10 reps      Manual Therapy   Manual Therapy Joint mobilization    Joint Mobilization patella mobilization    Soft tissue mobilization IASTMY to quad    Passive ROM into flexion and extension  PT Education - 11/27/20 1740    Education Details updated HEP    Person(s) Educated Patient    Methods Explanation;Demonstration;Tactile cues;Verbal cues    Comprehension Verbalized understanding;Returned demonstration;Verbal cues required;Tactile cues required            PT Short Term Goals - 11/20/20 0923      PT SHORT TERM GOAL #1   Title Patient will increase passive knee flexion to full    Baseline inital goal to 100 degrees reach goal updated to full flexion    Status Revised    Target Date 01/01/21      PT SHORT TERM GOAL #2   Title Patient will perfrom SLR without assistance    Baseline Perfroming full SLR    Time 3    Period Weeks    Status Achieved      PT SHORT TERM GOAL #3   Title Patient will demonstrate full extension without pain    Baseline dull extension    Time 3    Period Weeks    Status Achieved      PT SHORT  TERM GOAL #4   Title Patient will ambualte 300' without a crutch without pain    Time 3    Period Weeks    Status New    Target Date 12/11/20      PT SHORT TERM GOAL #5   Title Patient wil perfrom closed chain weight brearing exercises without pain    Time 3    Period Weeks    Status New    Target Date 12/11/20             PT Long Term Goals - 11/20/20 0925      PT LONG TERM GOAL #1   Title Patient will be indepdnent with HEP including standing weight bearing exercises    Baseline ntaial HEP achieved. GZaol updated for standing vclosed chain exercises.    Time 6    Status Revised    Target Date 01/01/21      PT LONG TERM GOAL #2   Title Patient will ambulate 3000' withoiut AD with no sign of knee buckling    Baseline using crutches. NWB at this time    Time 6    Period Weeks    Status On-going    Target Date 01/01/21                 Plan - 11/27/20 1743    Clinical Impression Statement Patient is making good progress. He is allowed to wright bear on 1/1 but wont have therapy ntil 1/4. Therapy reviewed how to work on weight shifting and progress weight bearing in a safe manner. he reported minor pain but had no buckling. His range is progressing per visual inspection. therapywill continue to progress as tolerated.    Personal Factors and Comorbidities Comorbidity 1    Examination-Activity Limitations Bend;Carry;Stairs;Squat;Locomotion Level;Transfers    Examination-Participation Restrictions Cleaning;Community Activity;Shop;Medication Management    Stability/Clinical Decision Making Stable/Uncomplicated    Clinical Decision Making Low    Rehab Potential Excellent    PT Frequency 2x / week    PT Duration 8 weeks    PT Treatment/Interventions Electrical Stimulation;ADLs/Self Care Home Management;Cryotherapy;Gait training;Stair training;Functional mobility training;Therapeutic activities;Therapeutic exercise;Balance training;Manual techniques;Passive range of  motion;Taping    PT Next Visit Plan > 90 degrees ROM as able , gait training with 2 crutches PWB as tolerated,  facilitate quads , vaso,  manual    PT Home Exercise Plan Access  Code: [email or web address]: [email or web address]: 11/19/2021Prepared by: Alric Seton.Supine Quad Set - 5 x daily - 7 x weekly - 2 sets - 10 reps - 5 hold.Supine Heel Slide with Strap - 3 x daily - 7 x weekly - 1 sets - 5 reps - 15sec hold.Supine Knee Extension Stretch on Towel Roll - 1 x daily - 7 x weekly - 1 sets - 3 reps - 15 hold,   SLR x 10-20 2x/day RT    Consulted and Agree with Plan of Care Patient           Patient will benefit from skilled therapeutic intervention in order to improve the following deficits and impairments:  Pain,Decreased range of motion,Difficulty walking,Decreased activity tolerance,Decreased strength,Increased muscle spasms  Visit Diagnosis: Stiffness of right knee, not elsewhere classified  Acute pain of right knee  Other abnormalities of gait and mobility  Localized edema     Problem List There are no problems to display for this patient.   Dessie Coma  PT DPT  11/27/2020, 5:45 PM  [facility] [address] [place], [place], [address] Phone: [phone]   Fax:  [phone]  Name: Tyreese Thain MRN: [id number] Date of Birth: 1981/08/02

## 2020-12-02 ENCOUNTER — Ambulatory Visit: Payer: Medicaid Other | Admitting: Physical Therapy

## 2020-12-02 ENCOUNTER — Other Ambulatory Visit: Payer: Self-pay

## 2020-12-02 ENCOUNTER — Ambulatory Visit: Payer: Medicaid Other | Attending: Orthopedic Surgery | Admitting: Physical Therapy

## 2020-12-02 ENCOUNTER — Encounter: Payer: Self-pay | Admitting: Physical Therapy

## 2020-12-02 DIAGNOSIS — M25661 Stiffness of right knee, not elsewhere classified: Secondary | ICD-10-CM | POA: Insufficient documentation

## 2020-12-02 DIAGNOSIS — R600 Localized edema: Secondary | ICD-10-CM | POA: Diagnosis present

## 2020-12-02 DIAGNOSIS — R2689 Other abnormalities of gait and mobility: Secondary | ICD-10-CM | POA: Diagnosis present

## 2020-12-02 DIAGNOSIS — M25561 Pain in right knee: Secondary | ICD-10-CM | POA: Diagnosis present

## 2020-12-02 NOTE — Therapy (Signed)
[facility] Outpatient Rehabilitation [facility] [address] [place], [place], [address] Phone: [phone]   Fax:  [phone]  Physical Therapy Treatment  Patient Details  Name: Lance Collins MRN: [id number] Date of Birth: 1981-11-29 Referring Provider (PT): Dr Cammy Copa    Encounter Date: 12/02/2020   PT End of Session - 12/02/20 1410    Visit Number 9    Number of Visits 16    Date for PT Re-Evaluation 01/01/21    Authorization Type [employer]    PT Start Time 1100    PT Stop Time 1142    PT Time Calculation (min) 42 min    Activity Tolerance Patient tolerated treatment well;Patient limited by pain    Behavior During Therapy [facility] for tasks assessed/performed           Past Medical History:  Diagnosis Date  . Fracture    right ankle  . Inguinal hernia     Past Surgical History:  Procedure Laterality Date  . ANTERIOR CRUCIATE LIGAMENT REPAIR Right 10/02/2020   Procedure: right knee anterior cruciate ligament reconstruction quad autograft, meniscal root repair, medial collateral ligament repair;  Surgeon: Cammy Copa, MD;  Location: [facility] OR;  Service: Orthopedics;  Laterality: Right;  . MULTIPLE TOOTH EXTRACTIONS    . ORIF ANKLE FRACTURE Right 05/11/2018   Procedure: OPEN REDUCTION INTERNAL FIXATION (ORIF) ANKLE FRACTURE AND SYNDESMOSIS INJURY;  Surgeon: Myrene Galas, MD;  Location: [facility] OR;  Service: Orthopedics;  Laterality: Right;    There were no vitals filed for this visit.   Subjective Assessment - 12/02/20 1107    Subjective Patient reports he has been walking a little without the crutch at home. he had 1 instance of buxkling while walking    Pertinent History Right ankle Fracture 2019/    How long can you sit comfortably? Stiffens when he sits    How long can you walk comfortably? limited distances    Diagnostic tests Nothing post-op    Patient Stated Goals Being able to walk and extend his leg    Currently in Pain?  No/denies              [facility] PT Assessment - 12/02/20 0001      PROM   Right Knee Flexion 112                         [facility] Adult PT Treatment/Exercise - 12/02/20 0001      Knee/Hip Exercises: Standing   Heel Raises Limitations x20    Knee Flexion Limitations slow march left with graded UE support x15    Lateral Step Up 15 reps;Step Height: 4";Hand Hold: 1    Forward Step Up Step Height: 4";15 reps;Hand Hold: 1    Other Standing Knee Exercises SLS 3x20 sec hold          SLR forward and sideways 3x10 1lb weight  Bridging 3x10          PT Education - 12/02/20 1410    Education Details reviewed HEP and updated for single leg stance    Person(s) Educated Patient    Methods Explanation;Demonstration;Tactile cues;Verbal cues    Comprehension Verbalized understanding;Returned demonstration;Verbal cues required;Tactile cues required            PT Short Term Goals - 11/20/20 0923      PT SHORT TERM GOAL #1   Title Patient will increase passive knee flexion to full    Baseline inital  goal to 100 degrees reach goal updated to full flexion    Status Revised    Target Date 01/01/21      PT SHORT TERM GOAL #2   Title Patient will perfrom SLR without assistance    Baseline Perfroming full SLR    Time 3    Period Weeks    Status Achieved      PT SHORT TERM GOAL #3   Title Patient will demonstrate full extension without pain    Baseline dull extension    Time 3    Period Weeks    Status Achieved      PT SHORT TERM GOAL #4   Title Patient will ambualte 300' without a crutch without pain    Time 3    Period Weeks    Status New    Target Date 12/11/20      PT SHORT TERM GOAL #5   Title Patient wil perfrom closed chain weight brearing exercises without pain    Time 3    Period Weeks    Status New    Target Date 12/11/20             PT Long Term Goals - 11/20/20 0925      PT LONG TERM GOAL #1   Title Patient will be indepdnent with HEP  including standing weight bearing exercises    Baseline ntaial HEP achieved. GZaol updated for standing vclosed chain exercises.    Time 6    Status Revised    Target Date 01/01/21      PT LONG TERM GOAL #2   Title Patient will ambulate 3000' withoiut AD with no sign of knee buckling    Baseline using crutches. NWB at this time    Time 6    Period Weeks    Status On-going    Target Date 01/01/21                 Plan - 12/02/20 1411    Clinical Impression Statement Patient continues to make good progress. his total arc was measured at 0-112 with mild pain at end range. He was given single leg stance for home and therapy also began stair training. He had no significant increase in pain. he continues to report his knee is numb. He was given updated HEP for SLS. Wewill continue to progress as tolerated.    Comorbidities right ankle fx 2019    Examination-Activity Limitations Bend;Carry;Stairs;Squat;Locomotion Level;Transfers    Examination-Participation Restrictions Cleaning;Community Activity;Shop;Medication Management    Stability/Clinical Decision Making Stable/Uncomplicated    PT Frequency 2x / week    PT Duration 8 weeks    PT Treatment/Interventions Electrical Stimulation;ADLs/Self Care Home Management;Cryotherapy;Gait training;Stair training;Functional mobility training;Therapeutic activities;Therapeutic exercise;Balance training;Manual techniques;Passive range of motion;Taping    PT Next Visit Plan > 90 degrees ROM as able , gait training with 2 crutches PWB as tolerated,  facilitate quads , vaso,  manual    PT Home Exercise Plan Access Code: [email or web address]: [email or web address]: 11/19/2021Prepared by: Oren Bracket.Supine Quad Set - 5 x daily - 7 x weekly - 2 sets - 10 reps - 5 hold.Supine Heel Slide with Strap - 3 x daily - 7 x weekly - 1 sets - 5 reps - 15sec hold.Supine Knee Extension Stretch on Towel Roll - 1 x daily - 7 x weekly - 1 sets - 3 reps -  15 hold,   SLR x 10-20 2x/day RT    Consulted and Agree with Plan of Care Patient  Patient will benefit from skilled therapeutic intervention in order to improve the following deficits and impairments:  Pain,Decreased range of motion,Difficulty walking,Decreased activity tolerance,Decreased strength,Increased muscle spasms  Visit Diagnosis: Stiffness of right knee, not elsewhere classified  Acute pain of right knee  Other abnormalities of gait and mobility  Localized edema     Problem List There are no problems to display for this patient.   Dessie Coma PT DPt 12/02/2020, 2:38 PM  [facility] Outpatient Rehabilitation [facility] [address] [place], [place], [address] Phone: [phone]   Fax:  [phone]  Name: Lance Collins MRN: [id number] Date of Birth: 06/14/1981

## 2020-12-10 ENCOUNTER — Ambulatory Visit (INDEPENDENT_AMBULATORY_CARE_PROVIDER_SITE_OTHER): Payer: Medicaid Other | Admitting: Orthopedic Surgery

## 2020-12-10 ENCOUNTER — Other Ambulatory Visit: Payer: Self-pay

## 2020-12-10 DIAGNOSIS — S83511A Sprain of anterior cruciate ligament of right knee, initial encounter: Secondary | ICD-10-CM

## 2020-12-11 ENCOUNTER — Encounter: Payer: Self-pay | Admitting: Physical Therapy

## 2020-12-11 ENCOUNTER — Ambulatory Visit: Payer: Medicaid Other | Admitting: Physical Therapy

## 2020-12-11 DIAGNOSIS — M25661 Stiffness of right knee, not elsewhere classified: Secondary | ICD-10-CM

## 2020-12-11 DIAGNOSIS — R2689 Other abnormalities of gait and mobility: Secondary | ICD-10-CM

## 2020-12-11 DIAGNOSIS — M25561 Pain in right knee: Secondary | ICD-10-CM

## 2020-12-11 DIAGNOSIS — R600 Localized edema: Secondary | ICD-10-CM

## 2020-12-12 MED ORDER — TRAMADOL HCL 50 MG PO TABS: 50.0000 mg | ORAL_TABLET | Freq: Two times a day (BID) | ORAL | 0 refills | Status: DC | PRN

## 2020-12-12 NOTE — Therapy (Signed)
[facility] Outpatient Rehabilitation [facility] [address] [place], [place], [address] Phone: [phone]   Fax:  [phone]  Physical Therapy Treatment  Patient Details  Name: Lance Collins MRN: [id number] Date of Birth: November 14, 1981 Referring Provider (PT): Dr Cammy Copa    Encounter Date: 12/11/2020   PT End of Session - 12/12/20 0720    Visit Number 10    Authorization Type [employer]    Authorization - Visit Number 10    Authorization - Number of Visits 12    PT Start Time 1100    PT Stop Time 1141    PT Time Calculation (min) 41 min    Activity Tolerance Patient tolerated treatment well;Patient limited by pain    Behavior During Therapy [facility] for tasks assessed/performed           Past Medical History:  Diagnosis Date  . Fracture    right ankle  . Inguinal hernia     Past Surgical History:  Procedure Laterality Date  . ANTERIOR CRUCIATE LIGAMENT REPAIR Right 10/02/2020   Procedure: right knee anterior cruciate ligament reconstruction quad autograft, meniscal root repair, medial collateral ligament repair;  Surgeon: Cammy Copa, MD;  [facility]: [facility] OR;  Service: Orthopedics;  Laterality: Right;  . MULTIPLE TOOTH EXTRACTIONS    . ORIF ANKLE FRACTURE Right 05/11/2018   Procedure: OPEN REDUCTION INTERNAL FIXATION (ORIF) ANKLE FRACTURE AND SYNDESMOSIS INJURY;  Surgeon: Myrene Galas, MD;  Location: [facility] OR;  Service: Orthopedics;  Laterality: Right;    There were no vitals filed for this visit.   Subjective Assessment - 12/11/20 1103    Subjective Patient reports that he has been sore in the morning. he reports his pain was up to an 8 this morning.    Pertinent History Right ankle Fracture 2019/    How long can you sit comfortably? Stiffens when he sits    Currently in Pain? No/denies                             [facility] Adult PT Treatment/Exercise - 12/12/20 0001      Knee/Hip Exercises: Standing   Heel Raises  Limitations x20    Knee Flexion Limitations slow march left with graded UE support x15    Lateral Step Up 15 reps;Step Height: 4";Hand Hold: 1    Forward Step Up Step Height: 4";15 reps;Hand Hold: 1    Other Standing Knee Exercises SLS 3x20 sec hold      Knee/Hip Exercises: Supine   Straight Leg Raises Limitations 2x15 1lb    Other Supine Knee/Hip Exercises bridge with band 2x15      Knee/Hip Exercises: Sidelying   Other Sidelying Knee/Hip Exercises 2x15 1lb SL abduction      Knee/Hip Exercises: Prone   Other Prone Exercises 2x15      Manual Therapy   Passive ROM PROM into flexion and extension                  PT Education - 12/12/20 0720    Education Details HEP and symptom mangement    Person(s) Educated Patient    Methods Demonstration;Tactile cues;Explanation;Verbal cues    Comprehension Verbalized understanding;Returned demonstration;Verbal cues required;Tactile cues required            PT Short Term Goals - 11/20/20 0923      PT SHORT TERM GOAL #1   Title Patient will increase passive knee flexion to full  Baseline inital goal to 100 degrees reach goal updated to full flexion    Status Revised    Target Date 01/01/21      PT SHORT TERM GOAL #2   Title Patient will perfrom SLR without assistance    Baseline Perfroming full SLR    Time 3    Period Weeks    Status Achieved      PT SHORT TERM GOAL #3   Title Patient will demonstrate full extension without pain    Baseline dull extension    Time 3    Period Weeks    Status Achieved      PT SHORT TERM GOAL #4   Title Patient will ambualte 300' without a crutch without pain    Time 3    Period Weeks    Status New    Target Date 12/11/20      PT SHORT TERM GOAL #5   Title Patient wil perfrom closed chain weight brearing exercises without pain    Time 3    Period Weeks    Status New    Target Date 12/11/20             PT Long Term Goals - 11/20/20 0925      PT LONG TERM GOAL #1    Title Patient will be indepdnent with HEP including standing weight bearing exercises    Baseline ntaial HEP achieved. GZaol updated for standing vclosed chain exercises.    Time 6    Status Revised    Target Date 01/01/21      PT LONG TERM GOAL #2   Title Patient will ambulate 3000' withoiut AD with no sign of knee buckling    Baseline using crutches. NWB at this time    Time 6    Period Weeks    Status On-going    Target Date 01/01/21                 Plan - 12/11/20 1120    Clinical Impression Statement Therapy advanced standing exercises today. We added stair ttraining and single leg stance training. We also put him on the leg press. He had no signifcant increase in pain. He reports fatigue with ther-ex. Per patient MD reccomended blood flo restriction next visit. patient did not have time this visit.    Personal Factors and Comorbidities Comorbidity 1    Comorbidities right ankle fx 2019    Examination-Activity Limitations Bend;Carry;Stairs;Squat;Locomotion Level;Transfers    Examination-Participation Restrictions Cleaning;Community Activity;Shop;Medication Management    Stability/Clinical Decision Making Stable/Uncomplicated    Clinical Decision Making Low    Rehab Potential Excellent    PT Frequency 2x / week    PT Duration 8 weeks    PT Treatment/Interventions Electrical Stimulation;ADLs/Self Care Home Management;Cryotherapy;Gait training;Stair training;Functional mobility training;Therapeutic activities;Therapeutic exercise;Balance training;Manual techniques;Passive range of motion;Taping    PT Next Visit Plan > 90 degrees ROM as able , gait training with 2 crutches PWB as tolerated,  facilitate quads , vaso,  manual    PT Home Exercise Plan Access Code: [email or web address]: [email or web address]: 11/19/2021Prepared by: Alric Seton.Supine Quad Set - 5 x daily - 7 x weekly - 2 sets - 10 reps - 5 hold.Supine Heel Slide with Strap - 3 x daily - 7 x weekly  - 1 sets - 5 reps - 15sec hold.Supine Knee Extension Stretch on Towel Roll - 1 x daily - 7 x weekly - 1 sets - 3 reps - 15 hold,   SLR x 10-20 2x/day  RT    Consulted and Agree with Plan of Care Patient           Patient will benefit from skilled therapeutic intervention in order to improve the following deficits and impairments:  Pain,Decreased range of motion,Difficulty walking,Decreased activity tolerance,Decreased strength,Increased muscle spasms  Visit Diagnosis: Stiffness of right knee, not elsewhere classified  Acute pain of right knee  Other abnormalities of gait and mobility  Localized edema     Problem List There are no problems to display for this patient.   Dessie Coma  PT DPT  12/12/2020, 7:23 AM  [facility] [address] [place], [place], [address] Phone: [phone]   Fax:  [phone]  Name: Lance Collins MRN: [id number] Date of Birth: 15-Apr-1981

## 2020-12-13 ENCOUNTER — Encounter: Payer: Self-pay | Admitting: Orthopedic Surgery

## 2020-12-13 NOTE — Progress Notes (Unsigned)
Post-Op Visit Note   Patient: Lance Collins           Date of Birth: [birth year]           MRN: [id number] Visit Date: 12/10/2020 PCP: [facility]   Assessment & Plan:  Chief Complaint:  Chief Complaint  Patient presents with  . Right Knee - Routine Post Op   Visit Diagnoses:  1. Rupture of anterior cruciate ligament of right knee, initial encounter     Plan: Patient is a 40 year old male who presents s/p right knee ACL reconstruction with quadriceps autograft and semitendinosus allograft, lateral meniscal root repair, MCL repair with internal brace.  He returns for 4-week follow-up visit.  He began weightbearing on 11/29/2020.  He takes tramadol for pain control about 1 time per day.  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  He is going to physical therapy and he is scheduled in physical therapy until 2/15.  He is currently working on quadricep strengthening and walking.  He is going to physical therapy at [facility].  Plan to start blood flow restriction therapy tomorrow.  He has 0 degrees extension and 120 degrees flexion on exam today.    Able to perform straight leg raise but he does have significant quad atrophy compared with contralateral side.  Quad strength is lacking compared with his nonoperative leg.  He denies any symptomatic instability of the knee except if he walks for 3 to 4 hours as his knee fatigues and feels like it "wants to buckle".  He has no instability unless he has been walking for a long amount of time.    ACL is intact on Lachman exam.  MCL intact with very slight laxity but a solid endpoint.  No effusion on exam.  No calf tenderness.  Negative Homan sign.  Incisions have healed well without any evidence of infection or dehiscence.  He is able to ambulate fairly well though he was instructed to make sure that he hits the ground with his heel first and make  sure to straighten his leg when he walks.  He understands and overall he seems to be progressing well.  Plan to follow-up in 4 weeks for clinical recheck.  Refilled tramadol for pain control.  Work note provided.  Follow-Up Instructions: No follow-ups on file.   Orders:  No orders of the defined types were placed in this encounter.  Meds ordered this encounter  Medications  . traMADol (ULTRAM) 50 MG tablet    Sig: Take 1 tablet (50 mg total) by mouth every 12 (twelve) hours as needed.    Dispense:  30 tablet    Refill:  0    Imaging: No results found.  PMFS History: There are no problems to display for this patient.  Past Medical History:  Diagnosis Date  . Fracture    right ankle  . Inguinal hernia     Family History  Problem Relation Age of Onset  . Crohn's disease Mother     Past Surgical History:  Procedure Laterality Date  . ANTERIOR CRUCIATE LIGAMENT REPAIR Right 10/02/2020   Procedure: right knee anterior cruciate ligament reconstruction quad autograft, meniscal root repair, medial collateral ligament repair;  Surgeon: Cammy Copa, MD;  Location: [facility] OR;  Service: Orthopedics;  Laterality: Right;  . MULTIPLE TOOTH EXTRACTIONS    . ORIF ANKLE FRACTURE Right 05/11/2018   Procedure: OPEN  REDUCTION INTERNAL FIXATION (ORIF) ANKLE FRACTURE AND SYNDESMOSIS INJURY;  Surgeon: Myrene Galas, MD;  Location: [facility] OR;  Service: Orthopedics;  Laterality: Right;   Social History   Occupational History  . Not on file  Tobacco Use  . Smoking status: Never Smoker  . Smokeless tobacco: Never Used  Vaping Use  . Vaping Use: Never used  Substance and Sexual Activity  . Alcohol use: Yes    Alcohol/week: 2.0 standard drinks    Types: 2 Cans of beer per week  . Drug use: No  . Sexual activity: Not on file

## 2020-12-16 ENCOUNTER — Ambulatory Visit: Payer: Medicaid Other | Admitting: Physical Therapy

## 2020-12-23 ENCOUNTER — Other Ambulatory Visit: Payer: Self-pay

## 2020-12-23 ENCOUNTER — Ambulatory Visit: Payer: Medicaid Other | Admitting: Physical Therapy

## 2020-12-23 ENCOUNTER — Encounter: Payer: Self-pay | Admitting: Physical Therapy

## 2020-12-23 DIAGNOSIS — R2689 Other abnormalities of gait and mobility: Secondary | ICD-10-CM

## 2020-12-23 DIAGNOSIS — R600 Localized edema: Secondary | ICD-10-CM

## 2020-12-23 DIAGNOSIS — M25661 Stiffness of right knee, not elsewhere classified: Secondary | ICD-10-CM | POA: Diagnosis not present

## 2020-12-23 DIAGNOSIS — M25561 Pain in right knee: Secondary | ICD-10-CM

## 2020-12-23 NOTE — Therapy (Signed)
[facility] Outpatient Rehabilitation [facility] [address] [place], [place], [address] Phone: [phone]   Fax:  [phone]  Physical Therapy Treatment  Patient Details  Name: Lance Collins MRN: [id number] Date of Birth: [birth year] Referring Provider (PT): Dr Cammy Copa    Encounter Date: 12/23/2020    Past Medical History:  Diagnosis Date  . Fracture    right ankle  . Inguinal hernia     Past Surgical History:  Procedure Laterality Date  . ANTERIOR CRUCIATE LIGAMENT REPAIR Right 10/02/2020   Procedure: right knee anterior cruciate ligament reconstruction quad autograft, meniscal root repair, medial collateral ligament repair;  Surgeon: Cammy Copa, MD;  [facility]: [facility] OR;  Service: Orthopedics;  Laterality: Right;  . MULTIPLE TOOTH EXTRACTIONS    . ORIF ANKLE FRACTURE Right 05/11/2018   Procedure: OPEN REDUCTION INTERNAL FIXATION (ORIF) ANKLE FRACTURE AND SYNDESMOSIS INJURY;  Surgeon: Myrene Galas, MD;  Location: [facility] OR;  Service: Orthopedics;  Laterality: Right;    There were no vitals filed for this visit.   Subjective Assessment - 12/23/20 1031    Subjective Patient reports at times he has been a little sore. he is not having pain today. He cones in with an antalgic gait.    Pertinent History Right ankle Fracture 2019/    How long can you sit comfortably? Stiffens when he sits    How long can you stand comfortably? can stand with crutches    How long can you walk comfortably? limited distances    Diagnostic tests Nothing post-op    Patient Stated Goals Being able to walk and extend his leg    Currently in Pain? No/denies                             [facility] Adult PT Treatment/Exercise - 12/23/20 0001      Knee/Hip Exercises: Standing   Heel Raises Limitations x20    Lateral Step Up 15 reps;Step Height: 4";Hand Hold: 1    Forward Step Up Step Height: 4";15 reps;Hand Hold: 1    Step Down 15 reps;Step Height: 4"     Other Standing Knee Exercises cabl;walk 4 plates back L79      Knee/Hip Exercises: Supine   Quad Sets Limitations blood flow restirction 80% 143  3z15    Bridges Limitations x30    Straight Leg Raises Limitations 2x15      Knee/Hip Exercises: Sidelying   Other Sidelying Knee/Hip Exercises 2x15 1lb SL abduction                  PT Education - 12/23/20 1033    Education Details reviewed benefits and risk of blood flow restriction    Person(s) Educated Patient    Methods Explanation;Demonstration;Tactile cues;Verbal cues    Comprehension Verbalized understanding;Returned demonstration;Verbal cues required;Tactile cues required            PT Short Term Goals - 11/20/20 0923      PT SHORT TERM GOAL #1   Title Patient will increase passive knee flexion to full    Baseline inital goal to 100 degrees reach goal updated to full flexion    Status Revised    Target Date 01/01/21      PT SHORT TERM GOAL #2   Title Patient will perfrom SLR without assistance    Baseline Perfroming full SLR    Time 3    Period Weeks    Status Achieved  PT SHORT TERM GOAL #3   Title Patient will demonstrate full extension without pain    Baseline dull extension    Time 3    Period Weeks    Status Achieved      PT SHORT TERM GOAL #4   Title Patient will ambualte 300' without a crutch without pain    Time 3    Period Weeks    Status New    Target Date 12/11/20      PT SHORT TERM GOAL #5   Title Patient wil perfrom closed chain weight brearing exercises without pain    Time 3    Period Weeks    Status New    Target Date 12/11/20             PT Long Term Goals - 12/23/20 1611      PT LONG TERM GOAL #1   Title Patient will be indepdnent with HEP including standing weight bearing exercises    Baseline ntaial HEP achieved. GZaol updated for standing vclosed chain exercises.    Time 6    Period Weeks    Status Revised      PT LONG TERM GOAL #2   Title Patient will  ambulate 3000' withoiut AD with no sign of knee buckling    Baseline using crutches. NWB at this time    Time 6    Period Weeks    Status On-going                 Plan - 12/23/20 1035    Clinical Impression Statement BFR 80% restricted at 143. Patient reported significant fatigue wih BFR but was able to tolerate. Overall he did well with it. He tolerated all other ther-ex well. We will continue to work on single leg stability. We will continue to progress as tolerated. Therapy aslo added cable walks. He tolerated well.    Personal Factors and Comorbidities Comorbidity 1    Comorbidities right ankle fx 2019    Examination-Activity Limitations Bend;Carry;Stairs;Squat;Locomotion Level;Transfers    Examination-Participation Restrictions Cleaning;Community Activity;Shop;Medication Management    Stability/Clinical Decision Making Stable/Uncomplicated    Clinical Decision Making Low    Rehab Potential Excellent    PT Frequency 2x / week    PT Duration 8 weeks    PT Treatment/Interventions Electrical Stimulation;ADLs/Self Care Home Management;Cryotherapy;Gait training;Stair training;Functional mobility training;Therapeutic activities;Therapeutic exercise;Balance training;Manual techniques;Passive range of motion;Taping    PT Next Visit Plan > 90 degrees ROM as able , gait training with 2 crutches PWB as tolerated,  facilitate quads , vaso,  manual    PT Home Exercise Plan Access Code: [email or web address]: [email or web address]: 11/19/2021Prepared by: Alric Seton.Supine Quad Set - 5 x daily - 7 x weekly - 2 sets - 10 reps - 5 hold.Supine Heel Slide with Strap - 3 x daily - 7 x weekly - 1 sets - 5 reps - 15sec hold.Supine Knee Extension Stretch on Towel Roll - 1 x daily - 7 x weekly - 1 sets - 3 reps - 15 hold,   SLR x 10-20 2x/day RT    Consulted and Agree with Plan of Care Patient           Patient will benefit from skilled therapeutic intervention in order to improve  the following deficits and impairments:  Pain,Decreased range of motion,Difficulty walking,Decreased activity tolerance,Decreased strength,Increased muscle spasms  Visit Diagnosis: Stiffness of right knee, not elsewhere classified  Acute pain of right knee  Other abnormalities of gait and mobility  Localized  edema     Problem List There are no problems to display for this patient.   Dessie Coma PT DPT  12/23/2020, 4:12 PM  [facility] [address] [place], [place], [address] Phone: [phone]   Fax:  [phone]  Name: Lance Collins MRN: [id number] Date of Birth: [birth year]

## 2020-12-30 ENCOUNTER — Ambulatory Visit: Payer: Medicaid Other | Admitting: Physical Therapy

## 2021-01-06 ENCOUNTER — Other Ambulatory Visit: Payer: Self-pay

## 2021-01-06 ENCOUNTER — Ambulatory Visit: Payer: Medicaid Other | Attending: Orthopedic Surgery | Admitting: Physical Therapy

## 2021-01-06 ENCOUNTER — Encounter: Payer: Self-pay | Admitting: Physical Therapy

## 2021-01-06 DIAGNOSIS — R2689 Other abnormalities of gait and mobility: Secondary | ICD-10-CM | POA: Diagnosis present

## 2021-01-06 DIAGNOSIS — M25561 Pain in right knee: Secondary | ICD-10-CM | POA: Insufficient documentation

## 2021-01-06 DIAGNOSIS — R600 Localized edema: Secondary | ICD-10-CM | POA: Insufficient documentation

## 2021-01-06 DIAGNOSIS — M25661 Stiffness of right knee, not elsewhere classified: Secondary | ICD-10-CM | POA: Diagnosis present

## 2021-01-07 NOTE — Therapy (Signed)
[facility] [address] [place], [place], [address] Phone: [phone]   Fax:  [phone]  Physical Therapy Treatment  Patient Details  Name: Lance Collins MRN: [id number] Date of Birth: 03-05-1981 Referring Provider (PT): Dr Cammy Copa    Encounter Date: 01/06/2021   PT End of Session - 01/07/21 1319    Visit Number 12    Number of Visits 16    Date for PT Re-Evaluation --   will re-assess next visit   Authorization Type [employer]    Authorization Time Period recert next visit    Authorization - Visit Number 12    Authorization - Number of Visits 12    PT Start Time 1415    PT Stop Time 1458    PT Time Calculation (min) 43 min    Activity Tolerance Patient tolerated treatment well;Patient limited by pain    Behavior During Therapy [facility] for tasks assessed/performed           Past Medical History:  Diagnosis Date  . Fracture    right ankle  . Inguinal hernia     Past Surgical History:  Procedure Laterality Date  . ANTERIOR CRUCIATE LIGAMENT REPAIR Right 10/02/2020   Procedure: right knee anterior cruciate ligament reconstruction quad autograft, meniscal root repair, medial collateral ligament repair;  Surgeon: Cammy Copa, MD;  Location: [facility] OR;  Service: Orthopedics;  Laterality: Right;  . MULTIPLE TOOTH EXTRACTIONS    . ORIF ANKLE FRACTURE Right 05/11/2018   Procedure: OPEN REDUCTION INTERNAL FIXATION (ORIF) ANKLE FRACTURE AND SYNDESMOSIS INJURY;  Surgeon: Myrene Galas, MD;  Location: [facility] OR;  Service: Orthopedics;  Laterality: Right;    There were no vitals filed for this visit.   Subjective Assessment - 01/06/21 1420    Subjective Patient reports that Saturday he began having cracking in his knee. He has pain in his medial knee. He notices a lot of cracking and popping. The pain ended Monday and the craxking ended today.    Pertinent History Right ankle Fracture 2019/    How long can you sit  comfortably? Stiffens when he sits    How long can you stand comfortably? can stand with crutches    How long can you walk comfortably? limited distances    Diagnostic tests Nothing post-op    Patient Stated Goals Being able to walk and extend his leg    Currently in Pain? No/denies   felt it over the weekend   Pain Score --    Pain Location Knee    Pain Orientation Right    Pain Descriptors / Indicators Aching    Pain Type Acute pain    Pain Onset More than a month ago    Pain Frequency Constant    Aggravating Factors  movement of the leg    Pain Relieving Factors rest and pain medication    Effect of Pain on Daily Activities difficulty perfroming ADL's                             [facility] Adult PT Treatment/Exercise - 01/07/21 0001      Knee/Hip Exercises: Machines for Strengthening   Cybex Leg Press 3x10 60 lbs      Knee/Hip Exercises: Standing   Heel Raises Limitations x20    Lateral Step Up 15 reps;Hand Hold: 1;Step Height: 6"    Forward Step Up 15 reps;Hand Hold: 1;Step Height: 6"    Step  Down 15 reps;Step Height: 4"    Functional Squat Limitations reviewed tehcniuqe with squats 2x10 . Hapd minor pain when he squated too low    Other Standing Knee Exercises cabl;walk 5 plates back x5 fowd x5    Other Standing Knee Exercises kettle bell swing 10lb 2x10      Knee/Hip Exercises: Supine   Bridges Limitations x30    Straight Leg Raises Limitations 2x15      Knee/Hip Exercises: Sidelying   Other Sidelying Knee/Hip Exercises 2x15 1lb SL abduction      Manual Therapy   Joint Mobilization reviewed self knee cap mobilization                  PT Education - 01/06/21 1429    Education Details reviewed patellar crepitus and how to work on it if [employer].    Person(s) Educated Patient    Methods Explanation;Demonstration;Tactile cues;Verbal cues    Comprehension Returned demonstration;Tactile cues required;Verbal cues required;Verbalized  understanding            PT Short Term Goals - 11/20/20 0923      PT SHORT TERM GOAL #1   Title Patient will increase passive knee flexion to full    Baseline inital goal to 100 degrees reach goal updated to full flexion    Status Revised    Target Date 01/01/21      PT SHORT TERM GOAL #2   Title Patient will perfrom SLR without assistance    Baseline Perfroming full SLR    Time 3    Period Weeks    Status Achieved      PT SHORT TERM GOAL #3   Title Patient will demonstrate full extension without pain    Baseline dull extension    Time 3    Period Weeks    Status Achieved      PT SHORT TERM GOAL #4   Title Patient will ambualte 300' without a crutch without pain    Time 3    Period Weeks    Status New    Target Date 12/11/20      PT SHORT TERM GOAL #5   Title Patient wil perfrom closed chain weight brearing exercises without pain    Time 3    Period Weeks    Status New    Target Date 12/11/20             PT Long Term Goals - 12/23/20 1611      PT LONG TERM GOAL #1   Title Patient will be indepdnent with HEP including standing weight bearing exercises    Baseline ntaial HEP achieved. GZaol updated for standing vclosed chain exercises.    Time 6    Period Weeks    Status Revised      PT LONG TERM GOAL #2   Title Patient will ambulate 3000' withoiut AD with no sign of knee buckling    Baseline using crutches. NWB at this time    Time 6    Period Weeks    Status On-going                 Plan - 01/06/21 1430    Clinical Impression Statement Patient tolerated treatment well. We were able to review eexercises that he can do at the gy,. We added leg press, kettle bell swings, and wereviewed squat technique. . He had no signs of the creitus and pain that he had been having. We reviewed knee cap mobilization  if he has that happen again. Therapy will re-asses next visit. We will continue to advance single leg stance activity.    Personal Factors and  Comorbidities Comorbidity 1    Comorbidities right ankle fx 2019    Examination-Activity Limitations Bend;Carry;Stairs;Squat;Locomotion Level;Transfers    Examination-Participation Restrictions Cleaning;Community Activity;Shop;Medication Management    Stability/Clinical Decision Making Stable/Uncomplicated    Clinical Decision Making Low    Rehab Potential Excellent    PT Frequency 2x / week    PT Duration 8 weeks    PT Treatment/Interventions Electrical Stimulation;ADLs/Self Care Home Management;Cryotherapy;Gait training;Stair training;Functional mobility training;Therapeutic activities;Therapeutic exercise;Balance training;Manual techniques;Passive range of motion;Taping    PT Next Visit Plan > 90 degrees ROM as able , gait training with 2 crutches PWB as tolerated,  facilitate quads , vaso,  manual    PT Home Exercise Plan Access Code: [email or web address]: [email or web address]: 11/19/2021Prepared by: Alric Seton.Supine Quad Set - 5 x daily - 7 x weekly - 2 sets - 10 reps - 5 hold.Supine Heel Slide with Strap - 3 x daily - 7 x weekly - 1 sets - 5 reps - 15sec hold.Supine Knee Extension Stretch on Towel Roll - 1 x daily - 7 x weekly - 1 sets - 3 reps - 15 hold,   SLR x 10-20 2x/day RT    Consulted and Agree with Plan of Care Patient           Patient will benefit from skilled therapeutic intervention in order to improve the following deficits and impairments:  Pain,Decreased range of motion,Difficulty walking,Decreased activity tolerance,Decreased strength,Increased muscle spasms  Visit Diagnosis: Stiffness of right knee, not elsewhere classified  Acute pain of right knee  Other abnormalities of gait and mobility  Localized edema     Problem List There are no problems to display for this patient.   Dessie Coma PT DPT  01/07/2021, 1:28 PM  [facility] [address] [place], [place], [address] Phone:  [phone]   Fax:  [phone]  Name: Lance Collins MRN: [id number] Date of Birth: 03/29/1981

## 2021-01-13 ENCOUNTER — Other Ambulatory Visit: Payer: Self-pay

## 2021-01-13 ENCOUNTER — Encounter: Payer: Self-pay | Admitting: Physical Therapy

## 2021-01-13 ENCOUNTER — Ambulatory Visit: Payer: Medicaid Other | Admitting: Physical Therapy

## 2021-01-13 DIAGNOSIS — R2689 Other abnormalities of gait and mobility: Secondary | ICD-10-CM

## 2021-01-13 DIAGNOSIS — M25561 Pain in right knee: Secondary | ICD-10-CM

## 2021-01-13 DIAGNOSIS — M25661 Stiffness of right knee, not elsewhere classified: Secondary | ICD-10-CM | POA: Diagnosis not present

## 2021-01-13 DIAGNOSIS — R600 Localized edema: Secondary | ICD-10-CM

## 2021-01-13 NOTE — Therapy (Signed)
[facility] [address] [place], [place], [address] Phone: [phone]   Fax:  [phone]  Physical Therapy Treatment  Patient Details  Name: Lance Collins MRN: [id number] Date of Birth: 08-14-1981 Referring Provider (PT): Dr Cammy Copa    Encounter Date: 01/13/2021   PT End of Session - 01/13/21 1544    Visit Number 13    Number of Visits 16    Authorization Type [employer]    Authorization Time Period recert next visit    PT Start Time 1503    PT Stop Time 1545    PT Time Calculation (min) 42 min    Activity Tolerance Patient tolerated treatment well    Behavior During Therapy [facility] for tasks assessed/performed           Past Medical History:  Diagnosis Date  . Fracture    right ankle  . Inguinal hernia     Past Surgical History:  Procedure Laterality Date  . ANTERIOR CRUCIATE LIGAMENT REPAIR Right 10/02/2020   Procedure: right knee anterior cruciate ligament reconstruction quad autograft, meniscal root repair, medial collateral ligament repair;  Surgeon: Cammy Copa, MD;  Location: [facility] OR;  Service: Orthopedics;  Laterality: Right;  . MULTIPLE TOOTH EXTRACTIONS    . ORIF ANKLE FRACTURE Right 05/11/2018   Procedure: OPEN REDUCTION INTERNAL FIXATION (ORIF) ANKLE FRACTURE AND SYNDESMOSIS INJURY;  Surgeon: Myrene Galas, MD;  Location: [facility] OR;  Service: Orthopedics;  Laterality: Right;    There were no vitals filed for this visit.   Subjective Assessment - 01/13/21 1503    Subjective "All right for the most part, still feels a little tight"    Currently in Pain? No/denies                             [facility] Adult PT Treatment/Exercise - 01/13/21 0001      Knee/Hip Exercises: Aerobic   Recumbent Bike L3 x 5 min      Knee/Hip Exercises: Machines for Strengthening   Cybex Knee Flexion RLE 25lb 2x10    Cybex Leg Press 3x10 60 lbs, RLE 20lb 2x10      Knee/Hip Exercises: Standing    Lateral Step Up Both;1 set;10 reps;Hand Hold: 0;Step Height: 6"    Forward Step Up Both;1 set;5 reps;Hand Hold: 0;Step Height: 6"    Other Standing Knee Exercises cabl;walk 5 plates back x5 fowd x5 x5 2 plated side steps    Other Standing Knee Exercises RLE eccentric load step downs 4 in 2x10                    PT Short Term Goals - 11/20/20 0923      PT SHORT TERM GOAL #1   Title Patient will increase passive knee flexion to full    Baseline inital goal to 100 degrees reach goal updated to full flexion    Status Revised    Target Date 01/01/21      PT SHORT TERM GOAL #2   Title Patient will perfrom SLR without assistance    Baseline Perfroming full SLR    Time 3    Period Weeks    Status Achieved      PT SHORT TERM GOAL #3   Title Patient will demonstrate full extension without pain    Baseline dull extension    Time 3    Period Weeks    Status Achieved  PT SHORT TERM GOAL #4   Title Patient will ambualte 300' without a crutch without pain    Time 3    Period Weeks    Status New    Target Date 12/11/20      PT SHORT TERM GOAL #5   Title Patient wil perfrom closed chain weight brearing exercises without pain    Time 3    Period Weeks    Status New    Target Date 12/11/20             PT Long Term Goals - 12/23/20 1611      PT LONG TERM GOAL #1   Title Patient will be indepdnent with HEP including standing weight bearing exercises    Baseline ntaial HEP achieved. GZaol updated for standing vclosed chain exercises.    Time 6    Period Weeks    Status Revised      PT LONG TERM GOAL #2   Title Patient will ambulate 3000' withoiut AD with no sign of knee buckling    Baseline using crutches. NWB at this time    Time 6    Period Weeks    Status On-going                 Plan - 01/13/21 1545    Clinical Impression Statement Pt did well with a progression to REL strengthening. Some instability noted with light resisted side step. Cues  needed to reduce compensation with step ups. Pt with visible RLE shaking with eccentric step downs. Pt progressed to some RLE single leg strengthening on leg press and with HS curls without issue.    Personal Factors and Comorbidities Comorbidity 1    Comorbidities right ankle fx 2019    Examination-Activity Limitations Bend;Carry;Stairs;Squat;Locomotion Level;Transfers    Examination-Participation Restrictions Cleaning;Community Activity;Shop;Medication Management    Stability/Clinical Decision Making Stable/Uncomplicated    Rehab Potential Excellent    PT Frequency 2x / week    PT Duration 8 weeks    PT Treatment/Interventions Electrical Stimulation;ADLs/Self Care Home Management;Cryotherapy;Gait training;Stair training;Functional mobility training;Therapeutic activities;Therapeutic exercise;Balance training;Manual techniques;Passive range of motion;Taping    PT Next Visit Plan RLE functional strength           Patient will benefit from skilled therapeutic intervention in order to improve the following deficits and impairments:  Pain,Decreased range of motion,Difficulty walking,Decreased activity tolerance,Decreased strength,Increased muscle spasms  Visit Diagnosis: Other abnormalities of gait and mobility  Localized edema  Acute pain of right knee  Stiffness of right knee, not elsewhere classified     Problem List There are no problems to display for this patient.   Lance Collins, PTA 01/13/2021, 3:51 PM  [facility] [address] [place], [place], [address] Phone: [phone]   Fax:  [phone]  Name: Lance Collins MRN: [id number] Date of Birth: 08-27-1981

## 2021-01-14 ENCOUNTER — Ambulatory Visit (INDEPENDENT_AMBULATORY_CARE_PROVIDER_SITE_OTHER): Payer: Medicaid Other | Admitting: Orthopedic Surgery

## 2021-01-14 ENCOUNTER — Ambulatory Visit: Payer: Medicaid Other | Admitting: Physical Therapy

## 2021-01-14 DIAGNOSIS — S83511A Sprain of anterior cruciate ligament of right knee, initial encounter: Secondary | ICD-10-CM

## 2021-01-15 ENCOUNTER — Telehealth: Payer: Self-pay | Admitting: Orthopedic Surgery

## 2021-01-15 NOTE — Telephone Encounter (Signed)
[place] forms received. Sent to [employer] 

## 2021-01-17 ENCOUNTER — Encounter: Payer: Self-pay | Admitting: Orthopedic Surgery

## 2021-01-17 NOTE — Progress Notes (Signed)
   Post-Op Visit Note   Patient: Lance Collins           Date of Birth: 11-10-1981           MRN: [id number] Visit Date: 01/14/2021 PCP: [facility]   Assessment & Plan:  Chief Complaint:  Chief Complaint  Patient presents with  . Right Knee - Knee Pain    Post op    Visit Diagnoses:  1. Rupture of anterior cruciate ligament of right knee, initial encounter     Plan: Rales the patient underwent multiligament surgery right knee 10/02/2021.  He has been doing leg press and step ups.  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  Continue to go to physical therapy.  On exam the knee is stable.  Trace effusion.  ACL feels stable MCL feels stable.  Range of motion is excellent.  Negative Homans no calf tenderness.  Plan is to continue current therapy to work on leg strengthening.  No cutting pivoting or sporting return yet.  Come back in 3 months for final recheck and release.  Follow-Up Instructions: Return in about 3 months (around 04/13/2021).   Orders:  No orders of the defined types were placed in this encounter.  No orders of the defined types were placed in this encounter.   Imaging: No results found.  PMFS History: There are no problems to display for this patient.  Past Medical History:  Diagnosis Date  . Fracture    right ankle  . Inguinal hernia     Family History  Problem Relation Age of Onset  . Crohn's disease Mother     Past Surgical History:  Procedure Laterality Date  . ANTERIOR CRUCIATE LIGAMENT REPAIR Right 10/02/2020   Procedure: right knee anterior cruciate ligament reconstruction quad autograft, meniscal root repair, medial collateral ligament repair;  Surgeon: Cammy Copa, MD;  Location: [facility] OR;  Service: Orthopedics;  Laterality: Right;  . MULTIPLE TOOTH EXTRACTIONS    . ORIF ANKLE FRACTURE Right 05/11/2018   Procedure: OPEN REDUCTION INTERNAL FIXATION (ORIF) ANKLE FRACTURE AND SYNDESMOSIS INJURY;  Surgeon: Myrene Galas, MD;  Location:  [facility] OR;  Service: Orthopedics;  Laterality: Right;   Social History   Occupational History  . Not on file  Tobacco Use  . Smoking status: Never Smoker  . Smokeless tobacco: Never Used  Vaping Use  . Vaping Use: Never used  Substance and Sexual Activity  . Alcohol use: Yes    Alcohol/week: 2.0 standard drinks    Types: 2 Cans of beer per week  . Drug use: No  . Sexual activity: Not on file

## 2021-01-28 ENCOUNTER — Ambulatory Visit: Payer: Medicaid Other | Attending: Orthopedic Surgery | Admitting: Physical Therapy

## 2021-01-28 DIAGNOSIS — R2689 Other abnormalities of gait and mobility: Secondary | ICD-10-CM | POA: Insufficient documentation

## 2021-01-28 DIAGNOSIS — M25661 Stiffness of right knee, not elsewhere classified: Secondary | ICD-10-CM | POA: Insufficient documentation

## 2021-01-28 DIAGNOSIS — R600 Localized edema: Secondary | ICD-10-CM | POA: Insufficient documentation

## 2021-01-28 DIAGNOSIS — M25561 Pain in right knee: Secondary | ICD-10-CM | POA: Insufficient documentation

## 2021-02-02 ENCOUNTER — Ambulatory Visit: Payer: Medicaid Other | Admitting: Physical Therapy

## 2021-02-02 ENCOUNTER — Telehealth: Payer: Self-pay | Admitting: Physical Therapy

## 2021-02-02 NOTE — Telephone Encounter (Signed)
Spoke to patient regarding his missed visit. He reports someone from [employer] called and told him therapy would no longer be covered. Therapy will follow up with insurance specialist.

## 2021-02-12 ENCOUNTER — Encounter: Payer: Self-pay | Admitting: Physical Therapy

## 2021-02-12 ENCOUNTER — Ambulatory Visit: Payer: Medicaid Other | Admitting: Physical Therapy

## 2021-02-12 ENCOUNTER — Other Ambulatory Visit: Payer: Self-pay

## 2021-02-12 DIAGNOSIS — R2689 Other abnormalities of gait and mobility: Secondary | ICD-10-CM | POA: Diagnosis not present

## 2021-02-12 DIAGNOSIS — R600 Localized edema: Secondary | ICD-10-CM

## 2021-02-12 DIAGNOSIS — M25661 Stiffness of right knee, not elsewhere classified: Secondary | ICD-10-CM | POA: Diagnosis present

## 2021-02-12 DIAGNOSIS — M25561 Pain in right knee: Secondary | ICD-10-CM | POA: Diagnosis present

## 2021-02-12 NOTE — Therapy (Addendum)
[place], [place], [address] Phone: [phone]   Fax:  [phone]  Physical Therapy Treatment/Re-cert/Discharge   Patient Details  Name: Lance Collins MRN: [id number] Date of Birth: 01/14/1981 Referring Provider (PT): Dr Meredith Pel    Encounter Date: 02/12/2021   PT End of Session - 02/12/21 0808    Visit Number 14    Number of Visits 20    Date for PT Re-Evaluation 03/26/21    Authorization Type [employer]    PT Start Time 0801    PT Stop Time 0841    PT Time Calculation (min) 40 min    Activity Tolerance Patient tolerated treatment well    Behavior During Therapy [facility] for tasks assessed/performed           Past Medical History:  Diagnosis Date  . Fracture    right ankle  . Inguinal hernia     Past Surgical History:  Procedure Laterality Date  . ANTERIOR CRUCIATE LIGAMENT REPAIR Right 10/02/2020   Procedure: right knee anterior cruciate ligament reconstruction quad autograft, meniscal root repair, medial collateral ligament repair;  Surgeon: Meredith Pel, MD;  Location: [place];  Service: Orthopedics;  Laterality: Right;  . MULTIPLE TOOTH EXTRACTIONS    . ORIF ANKLE FRACTURE Right 05/11/2018   Procedure: OPEN REDUCTION INTERNAL FIXATION (ORIF) ANKLE FRACTURE AND SYNDESMOSIS INJURY;  Surgeon: Altamese Bremer, MD;  Location: [place];  Service: Orthopedics;  Laterality: Right;    There were no vitals filed for this visit.   Subjective Assessment - 02/12/21 0806    Subjective Patient reports he has good days and bad days. He is hurting this morning. He has been working on his exercises as he can.,    Pertinent History Right ankle Fracture 2019/    How long can you sit comfortably? Stiffens when he sits    How long can you stand comfortably? can stand with crutches    How long can you walk comfortably? limited distances    Diagnostic tests Nothing post-op    Patient Stated Goals  Being able to walk and extend his leg    Currently in Pain? No/denies   just stiffness             [facility] PT Assessment - 02/12/21 0001      Assessment   Medical Diagnosis Right ACL repair/ Lateral Meiniscal repair/ MCL stabilization     Referring Provider (PT) Dr Meredith Pel       Functional Tests   Functional tests Single leg stance      Single Leg Stance   Comments needs UE support      PROM   Right Knee Extension 0    Right Knee Flexion 124      Strength   Strength Assessment Site Knee;Hip    Right/Left Hip Right    Right Hip Flexion 5/5    Right Hip ABduction 4+/5    Right Hip ADduction 4+/5    Right/Left Knee Right    Right Knee Flexion 4+/5    Right Knee Extension 4+/5      Ambulation/Gait   Pre-Gait Activities decreased single leg stance on the right                         [facility] Adult PT Treatment/Exercise - 02/12/21 0001      Self-Care   Other Self-Care Comments  reviewed HEp and symptom mangement  Knee/Hip Exercises: Machines for Strengthening   Cybex Leg Press 3x10 60 lbs, RLE 20lb 2x10      Knee/Hip Exercises: Standing   Lateral Step Up --    Lateral Step Up Limitations reviewe dfor HEP unable to complete 2nd to fatigue    Forward Step Up --    Forward Step Up Limitations given for HEp unable to complete 2nd to fatigue    Step Down --    Functional Squat Limitations x20    Other Standing Knee Exercises --    Other Standing Knee Exercises single leg stance 3x10 seconds with min-mod UE support      Knee/Hip Exercises: Seated   Long Arc Quad Limitations 2x15 red    Other Seated Knee/Hip Exercises hamstring flexion red 2x10      Knee/Hip Exercises: Supine   Bridges Limitations x30    Straight Leg Raises Limitations 2x15      Knee/Hip Exercises: Sidelying   Other Sidelying Knee/Hip Exercises 2x15 1lb SL abduction                  PT Education - 02/12/21 0808    Education Details HEP and symptom mangement     Person(s) Educated Patient    Methods Explanation;Demonstration;Tactile cues;Verbal cues    Comprehension Verbalized understanding;Returned demonstration;Verbal cues required;Tactile cues required            PT Short Term Goals - 02/12/21 1057      PT SHORT TERM GOAL #1   Title Patient will increase passive knee flexion to full    Baseline full    Time 3    Period Weeks    Status Achieved    Target Date 01/01/21      PT SHORT TERM GOAL #2   Title Patient will perfrom SLR without assistance    Baseline Perfroming full SLR    Period Weeks    Status Achieved      PT SHORT TERM GOAL #3   Title Patient will demonstrate full extension without pain    Baseline full extension    Time 3    Period Weeks    Status Achieved      PT SHORT TERM GOAL #4   Title Patient will ambualte 300' without a crutch without pain    Baseline ambualting without a crutch    Time 3    Period Weeks    Status Achieved    Target Date 12/11/20      PT SHORT TERM GOAL #5   Title Patient wil perfrom closed chain weight brearing exercises without pain    Baseline perfroming without pain but still sings of weakness with steps    Time 3    Period Weeks    Status On-going      PT SHORT TERM GOAL #6   Title Patient will demonstrate a30 second single leg stance time without instability in order to retrun to straight line running    Time 3    Period Weeks    Status New    Target Date 03/05/21             PT Long Term Goals - 02/12/21 1102      PT LONG TERM GOAL #1   Title Patient will be independent with HEP including standing weight bearing exercises    Baseline continue to streess the improtance of perfroming HEP    Time 6    Period Weeks    Status On-going        PT LONG TERM GOAL #2   Title Patient will ambulate 3000' withoiut AD with no sign of knee buckling    Baseline continues to have antalgic gait    Time 6    Period Weeks    Status On-going    Target Date 03/26/21      PT  LONG TERM GOAL #3   Title Patiewnt will return to straight line running    Baseline does not have enough single leg dynamic stability    Time 6    Period Weeks    Status New    Target Date 03/26/21                 Plan - 02/12/21 0827    Clinical Impression Statement Therapy reviewed patients exercise program with hi. He was given the exercises.to make sure to do at home. He still shows sings of quadd weakness with some basic exercise slike LAQ. he was strongly encouraged to continue with exercises consistently at home. He was advised no to run until he an doemsitrate improved single leg control and quad conbtrol with active exercise . He was given 3 mat exercises and 5 standing exercises to concentrate on at home. he was encouraged to get a weight. [employer] has said he needs to do something with his insurance. We will conitnue 1W6 if his insurance coverage continues. We will continue to progress single leg stability and strneghtening exercises as tolerated.    Comorbidities right ankle fx 2019    Examination-Participation Restrictions Cleaning;Community Activity;Shop;Medication Management    Stability/Clinical Decision Making Stable/Uncomplicated    Clinical Decision Making Low    Rehab Potential Excellent    PT Frequency 1x / week    PT Duration 6 weeks    PT Treatment/Interventions Electrical Stimulation;ADLs/Self Care Home Management;Cryotherapy;Gait training;Stair training;Functional mobility training;Therapeutic activities;Therapeutic exercise;Balance training;Manual techniques;Passive range of motion;Taping    PT Next Visit Plan review HEP    PT Home Exercise Plan Access Code: [email or web address]: [email or web address]: 11/19/2021Prepared by: David CarrollExercises.Supine Quad Set - 5 x daily - 7 x weekly - 2 sets - 10 reps - 5 hold.Supine Heel Slide with Strap - 3 x daily - 7 x weekly - 1 sets - 5 reps - 15sec hold.Supine Knee Extension Stretch on Towel Roll - 1 x  daily - 7 x weekly - 1 sets - 3 reps - 15 hold,   SLR x 10-20 2x/day RT    Consulted and Agree with Plan of Care Patient           Patient will benefit from skilled therapeutic intervention in order to improve the following deficits and impairments:  Pain,Decreased range of motion,Difficulty walking,Decreased activity tolerance,Decreased strength,Increased muscle spasms  Visit Diagnosis: Other abnormalities of gait and mobility  Localized edema  Acute pain of right knee  Stiffness of right knee, not elsewhere classified    Check all possible CPT codes: 97110- Therapeutic Exercise, 97112- Neuro Re-education, 97116 - Gait Training, 97140 - Manual Therapy, 97530 - Therapeutic Activities, 97535 - Self Care, 97012 - Mechanical traction, 97014 - Electrical stimulation (unattended), 97032 - Electrical stimulation (Manual), 97033 - Iontophoresis and 97035 - Ultrasound       PHYSICAL THERAPY DISCHARGE SUMMARY  Visits from Start of Care: 14  Current functional level related to goals / functional outcomes:improved ability to stand, walk, and perfrom IADL's   Remaining deficits: Pain with prolonged standing and walking; unable to run   Education / Equipment: HEP   Plan: Patient agrees to   discharge.  Patient goals were partially met. Patient is being discharged due to financial reasons.  ?????      Problem List There are no problems to display for this patient.   Carney Living PT DPT  02/12/2021, 11:07 AM  [facility] [address] [place], [place], [address] Phone: [phone]   Fax:  [phone]  Name: Pharoah Goggins MRN: [id number] Date of Birth: 08-Jul-1981

## 2021-02-16 ENCOUNTER — Telehealth: Payer: Self-pay | Admitting: Orthopedic Surgery

## 2021-02-16 NOTE — Telephone Encounter (Signed)
Called,advised need to contact [employer]

## 2021-02-16 NOTE — Telephone Encounter (Signed)
[employer] calling to see if we received the disability paperwork that was sent to our office on 02/10/21.  Please let Robby know if we have received and what the status is.  Call back # [phone] [id number].

## 2021-02-19 ENCOUNTER — Ambulatory Visit: Payer: Medicaid Other | Attending: Orthopedic Surgery | Admitting: Physical Therapy

## 2021-03-06 ENCOUNTER — Encounter: Payer: Self-pay | Admitting: Orthopedic Surgery

## 2021-03-06 NOTE — Telephone Encounter (Signed)
Okay for this

## 2021-03-10 ENCOUNTER — Telehealth: Payer: Self-pay

## 2021-03-10 NOTE — Telephone Encounter (Signed)
Kirsten with [employer] called and she would need a invoice sent over for the peer review including Drs Tax id  Call back number [phone] Fax # [phone]  Claim # [id number]

## 2021-03-11 NOTE — Telephone Encounter (Signed)
Tried calling her back. No answer. LM for her to call to discuss specifically what she is needing.

## 2021-03-16 NOTE — Telephone Encounter (Signed)
okay

## 2021-03-20 ENCOUNTER — Telehealth: Payer: Self-pay | Admitting: Orthopedic Surgery

## 2021-03-20 NOTE — Telephone Encounter (Signed)
Kristen with [employer] called stating they were wanting to do a peer review for long term disability . Baxter Hire states usually the insurance just review medical records but they believe it would be more beneficial if the pt talked to the physician first and when they originally called to set the P2P they were told there would be a fee. Baxter Hire states she tried to get an invoice but it was never sent over and so they had the pt do the P2P with a different DR. And she states there's a copy in the chart if Dr. August Saucer wants to look at it and leave any notes.   Baxter Hire CB# [phone]

## 2021-03-20 NOTE — Telephone Encounter (Signed)
Received call from [place] with [employer]. He gave phone number to contact case manager for Peer to Peer. The case manager is [employer] number    [phone]

## 2021-03-20 NOTE — Telephone Encounter (Signed)
IC LMVM advising was returning call to discuss. Not sure what P2P is needed for. Asked her to call back with details.

## 2021-03-23 NOTE — Telephone Encounter (Signed)
No she just said if Dr. August Saucer had any comments he could feel free to add them on.

## 2021-03-25 ENCOUNTER — Telehealth: Payer: Self-pay

## 2021-03-25 NOTE — Telephone Encounter (Signed)
Lance Collins from [employer] called he is requesting the status of disability forms he is requesting the forms to be filled out and faxed back call [phone] Fax:[phone]

## 2021-03-25 NOTE — Telephone Encounter (Signed)
Processed through [employer]

## 2021-04-13 ENCOUNTER — Ambulatory Visit: Payer: Medicaid Other | Admitting: Orthopedic Surgery

## 2021-05-04 ENCOUNTER — Ambulatory Visit: Payer: Medicaid Other | Admitting: Orthopedic Surgery

## 2021-11-26 ENCOUNTER — Emergency Department (HOSPITAL_BASED_OUTPATIENT_CLINIC_OR_DEPARTMENT_OTHER)
Admission: EM | Admit: 2021-11-26 | Discharge: 2021-11-26 | Disposition: A | Discharge status: Home / Self Care | Payer: Medicaid Other | Attending: Emergency Medicine | Admitting: Emergency Medicine

## 2021-11-26 ENCOUNTER — Emergency Department (HOSPITAL_BASED_OUTPATIENT_CLINIC_OR_DEPARTMENT_OTHER): Payer: Medicaid Other

## 2021-11-26 ENCOUNTER — Encounter (HOSPITAL_BASED_OUTPATIENT_CLINIC_OR_DEPARTMENT_OTHER): Payer: Self-pay

## 2021-11-26 ENCOUNTER — Other Ambulatory Visit: Payer: Self-pay

## 2021-11-26 DIAGNOSIS — S61412A Laceration without foreign body of left hand, initial encounter: Secondary | ICD-10-CM | POA: Diagnosis not present

## 2021-11-26 DIAGNOSIS — S61419A Laceration without foreign body of unspecified hand, initial encounter: Secondary | ICD-10-CM

## 2021-11-26 DIAGNOSIS — M79641 Pain in right hand: Secondary | ICD-10-CM
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
## 2021-11-26 MED ORDER — KETOROLAC TROMETHAMINE 15 MG/ML IJ SOLN
15.0000 mg | Freq: Once | INTRAMUSCULAR | Status: AC
  Administered 2021-11-26: 16:00:00 15 mg via INTRAMUSCULAR
  Filled 2021-11-26: qty 1

## 2021-11-26 MED ORDER — AMOXICILLIN-POT CLAVULANATE 875-125 MG PO TABS: 1.0000 | ORAL_TABLET | Freq: Two times a day (BID) | ORAL | 0 refills | Status: DC
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
## 2021-12-09 ENCOUNTER — Other Ambulatory Visit: Payer: Self-pay

## 2021-12-09 ENCOUNTER — Other Ambulatory Visit: Payer: Self-pay | Admitting: Orthopedic Surgery

## 2021-12-09 ENCOUNTER — Encounter (HOSPITAL_BASED_OUTPATIENT_CLINIC_OR_DEPARTMENT_OTHER): Payer: Self-pay | Admitting: Orthopedic Surgery

## 2021-12-10 ENCOUNTER — Ambulatory Visit (HOSPITAL_BASED_OUTPATIENT_CLINIC_OR_DEPARTMENT_OTHER)
Admission: RE | Admit: 2021-12-10 | Discharge: 2021-12-10 | Disposition: A | Discharge status: Home / Self Care | Payer: Medicaid Other | Attending: Orthopedic Surgery | Admitting: Orthopedic Surgery

## 2021-12-10 ENCOUNTER — Ambulatory Visit (HOSPITAL_BASED_OUTPATIENT_CLINIC_OR_DEPARTMENT_OTHER): Payer: Medicaid Other | Admitting: Anesthesiology

## 2021-12-10 ENCOUNTER — Other Ambulatory Visit: Payer: Self-pay

## 2021-12-10 ENCOUNTER — Encounter (HOSPITAL_BASED_OUTPATIENT_CLINIC_OR_DEPARTMENT_OTHER): Payer: Self-pay | Admitting: Orthopedic Surgery

## 2021-12-10 ENCOUNTER — Encounter (HOSPITAL_BASED_OUTPATIENT_CLINIC_OR_DEPARTMENT_OTHER)
Admission: RE | Disposition: A | Discharge status: Home / Self Care | Payer: Self-pay | Source: Home / Self Care | Attending: Orthopedic Surgery

## 2021-12-10 DIAGNOSIS — S61213A Laceration without foreign body of left middle finger without damage to nail, initial encounter: Secondary | ICD-10-CM | POA: Insufficient documentation

## 2021-12-10 DIAGNOSIS — F1290 Cannabis use, unspecified, uncomplicated: Secondary | ICD-10-CM | POA: Diagnosis not present

## 2021-12-10 DIAGNOSIS — Z79899 Other long term (current) drug therapy: Secondary | ICD-10-CM | POA: Insufficient documentation

## 2021-12-10 DIAGNOSIS — M00842 Arthritis due to other bacteria, left hand: Secondary | ICD-10-CM | POA: Diagnosis not present

## 2021-12-10 HISTORY — PX: INCISION AND DRAINAGE: SHX5863

## 2021-12-10 SURGERY — INCISION AND DRAINAGE
Anesthesia: General | Site: Hand | Laterality: Left

## 2021-12-10 MED ORDER — PROPOFOL 10 MG/ML IV BOLUS
INTRAVENOUS | Status: DC | PRN
Start: 2021-12-10 — End: 2021-12-10
  Administered 2021-12-10: 200 mg via INTRAVENOUS

## 2021-12-10 MED ORDER — BUPIVACAINE HCL (PF) 0.25 % IJ SOLN
INTRAMUSCULAR | Status: AC
  Filled 2021-12-10: qty 30

## 2021-12-10 MED ORDER — HYDROMORPHONE HCL 1 MG/ML IJ SOLN
INTRAMUSCULAR | Status: AC
  Filled 2021-12-10: qty 0.5

## 2021-12-10 MED ORDER — MIDAZOLAM HCL 2 MG/2ML IJ SOLN
INTRAMUSCULAR | Status: AC
  Filled 2021-12-10: qty 2

## 2021-12-10 MED ORDER — AMOXICILLIN-POT CLAVULANATE 875-125 MG PO TABS: 1.0000 | ORAL_TABLET | Freq: Two times a day (BID) | ORAL | 0 refills | Status: DC

## 2021-12-10 MED ORDER — LIDOCAINE HCL (CARDIAC) PF 100 MG/5ML IV SOSY
PREFILLED_SYRINGE | INTRAVENOUS | Status: DC | PRN
Start: 2021-12-10 — End: 2021-12-10
  Administered 2021-12-10: 100 mg via INTRATRACHEAL

## 2021-12-10 MED ORDER — OXYCODONE HCL 5 MG/5ML PO SOLN: 5.0000 mg | Freq: Once | ORAL | Status: AC | PRN

## 2021-12-10 MED ORDER — 0.9 % SODIUM CHLORIDE (POUR BTL) OPTIME
TOPICAL | Status: DC | PRN
  Administered 2021-12-10: 600 mL

## 2021-12-10 MED ORDER — ONDANSETRON HCL 4 MG/2ML IJ SOLN
INTRAMUSCULAR | Status: AC
  Filled 2021-12-10: qty 2

## 2021-12-10 MED ORDER — HYDROMORPHONE HCL 1 MG/ML IJ SOLN
0.2500 mg | INTRAMUSCULAR | Status: DC | PRN
  Administered 2021-12-10 (×3): 0.5 mg via INTRAVENOUS

## 2021-12-10 MED ORDER — MIDAZOLAM HCL 5 MG/5ML IJ SOLN
INTRAMUSCULAR | Status: DC | PRN
  Administered 2021-12-10: 2 mg via INTRAVENOUS

## 2021-12-10 MED ORDER — AMISULPRIDE (ANTIEMETIC) 5 MG/2ML IV SOLN: 10.0000 mg | Freq: Once | INTRAVENOUS | Status: DC | PRN

## 2021-12-10 MED ORDER — CEFAZOLIN SODIUM-DEXTROSE 2-4 GM/100ML-% IV SOLN
2.0000 g | Freq: Once | INTRAVENOUS | Status: AC
  Administered 2021-12-10: 2 g via INTRAVENOUS

## 2021-12-10 MED ORDER — ONDANSETRON HCL 4 MG/2ML IJ SOLN
INTRAMUSCULAR | Status: DC | PRN
Start: 2021-12-10 — End: 2021-12-10
  Administered 2021-12-10: 4 mg via INTRAVENOUS

## 2021-12-10 MED ORDER — OXYCODONE HCL 5 MG PO TABS
5.0000 mg | ORAL_TABLET | Freq: Once | ORAL | Status: AC | PRN
  Administered 2021-12-10: 5 mg via ORAL

## 2021-12-10 MED ORDER — ACETAMINOPHEN 10 MG/ML IV SOLN: 1000.0000 mg | Freq: Once | INTRAVENOUS | Status: DC | PRN

## 2021-12-10 MED ORDER — LACTATED RINGERS IV SOLN: INTRAVENOUS | Status: DC

## 2021-12-10 MED ORDER — DEXAMETHASONE SODIUM PHOSPHATE 10 MG/ML IJ SOLN
INTRAMUSCULAR | Status: DC | PRN
  Administered 2021-12-10: 5 mg via INTRAVENOUS

## 2021-12-10 MED ORDER — FENTANYL CITRATE (PF) 100 MCG/2ML IJ SOLN
INTRAMUSCULAR | Status: DC | PRN
  Administered 2021-12-10 (×2): 50 ug via INTRAVENOUS

## 2021-12-10 MED ORDER — OXYCODONE HCL 5 MG PO TABS
ORAL_TABLET | ORAL | Status: AC
  Filled 2021-12-10: qty 1

## 2021-12-10 MED ORDER — CEFAZOLIN SODIUM 1 G IJ SOLR
INTRAMUSCULAR | Status: AC
  Filled 2021-12-10: qty 20

## 2021-12-10 MED ORDER — HYDROCODONE-ACETAMINOPHEN 5-325 MG PO TABS: ORAL_TABLET | ORAL | 0 refills | Status: DC

## 2021-12-10 MED ORDER — BUPIVACAINE HCL (PF) 0.25 % IJ SOLN
INTRAMUSCULAR | Status: DC | PRN
  Administered 2021-12-10: 7 mL

## 2021-12-10 MED ORDER — FENTANYL CITRATE (PF) 100 MCG/2ML IJ SOLN
INTRAMUSCULAR | Status: AC
  Filled 2021-12-10: qty 2

## 2021-12-10 MED ORDER — ONDANSETRON HCL 4 MG/2ML IJ SOLN: 4.0000 mg | Freq: Once | INTRAMUSCULAR | Status: DC | PRN

## 2021-12-10 MED ORDER — PROPOFOL 10 MG/ML IV BOLUS
INTRAVENOUS | Status: AC
  Filled 2021-12-10: qty 20

## 2021-12-10 SURGICAL SUPPLY — 58 items
APL PRP STRL LF DISP 70% ISPRP (MISCELLANEOUS) ×1
BAG DECANTER FOR FLEXI CONT (MISCELLANEOUS) IMPLANT
BLADE MINI RND TIP GREEN BEAV (BLADE) IMPLANT
BLADE SURG 15 STRL LF DISP TIS (BLADE) ×2 IMPLANT
BLADE SURG 15 STRL SS (BLADE) ×4
BNDG CMPR 9X4 STRL LF SNTH (GAUZE/BANDAGES/DRESSINGS) ×1
BNDG COHESIVE 1X5 TAN STRL LF (GAUZE/BANDAGES/DRESSINGS) IMPLANT
BNDG ELASTIC 2X5.8 VLCR STR LF (GAUZE/BANDAGES/DRESSINGS) IMPLANT
BNDG ELASTIC 3X5.8 VLCR STR LF (GAUZE/BANDAGES/DRESSINGS) ×1 IMPLANT
BNDG ESMARK 4X9 LF (GAUZE/BANDAGES/DRESSINGS) ×1 IMPLANT
BNDG GAUZE 1X2.1 STRL (MISCELLANEOUS) IMPLANT
BNDG GAUZE ELAST 4 BULKY (GAUZE/BANDAGES/DRESSINGS) ×1 IMPLANT
CHLORAPREP W/TINT 26 (MISCELLANEOUS) ×2 IMPLANT
CORD BIPOLAR FORCEPS 12FT (ELECTRODE) ×2 IMPLANT
COVER BACK TABLE 60X90IN (DRAPES) ×2 IMPLANT
COVER MAYO STAND STRL (DRAPES) ×2 IMPLANT
CUFF TOURN SGL QUICK 18 NS (TOURNIQUET CUFF) ×1 IMPLANT
CUFF TOURN SGL QUICK 18X4 (TOURNIQUET CUFF) ×1 IMPLANT
DRAPE EXTREMITY T 121X128X90 (DISPOSABLE) ×2 IMPLANT
DRAPE SURG 17X23 STRL (DRAPES) ×1 IMPLANT
GAUZE PACKING IODOFORM 1/4X15 (PACKING) ×1 IMPLANT
GAUZE SPONGE 4X4 12PLY STRL (GAUZE/BANDAGES/DRESSINGS) ×2 IMPLANT
GAUZE XEROFORM 1X8 LF (GAUZE/BANDAGES/DRESSINGS) ×2 IMPLANT
GLOVE SRG 8 PF TXTR STRL LF DI (GLOVE) ×1 IMPLANT
GLOVE SURG ENC MOIS LTX SZ7.5 (GLOVE) ×2 IMPLANT
GLOVE SURG ORTHO LTX SZ8 (GLOVE) ×1 IMPLANT
GLOVE SURG POLYISO LF SZ7 (GLOVE) ×2 IMPLANT
GLOVE SURG UNDER POLY LF SZ7 (GLOVE) ×4 IMPLANT
GLOVE SURG UNDER POLY LF SZ8 (GLOVE) ×2
GLOVE SURG UNDER POLY LF SZ8.5 (GLOVE) ×1 IMPLANT
GOWN STRL REUS W/ TWL LRG LVL3 (GOWN DISPOSABLE) ×1 IMPLANT
GOWN STRL REUS W/TWL LRG LVL3 (GOWN DISPOSABLE) ×2
GOWN STRL REUS W/TWL XL LVL3 (GOWN DISPOSABLE) ×3 IMPLANT
IV CATH PLACEMENT 20 GA (IV SOLUTION) ×1 IMPLANT
LOOP VESSEL MAXI BLUE (MISCELLANEOUS) IMPLANT
NDL BLUNT 17GA (NEEDLE) IMPLANT
NDL HYPO 25X1 1.5 SAFETY (NEEDLE) IMPLANT
NEEDLE BLUNT 17GA (NEEDLE) IMPLANT
NEEDLE HYPO 25X1 1.5 SAFETY (NEEDLE) ×2 IMPLANT
NS IRRIG 1000ML POUR BTL (IV SOLUTION) ×2 IMPLANT
PACK BASIN DAY SURGERY FS (CUSTOM PROCEDURE TRAY) ×2 IMPLANT
PAD CAST 3X4 CTTN HI CHSV (CAST SUPPLIES) IMPLANT
PADDING CAST ABS 4INX4YD NS (CAST SUPPLIES) ×1
PADDING CAST ABS COTTON 4X4 ST (CAST SUPPLIES) ×1 IMPLANT
PADDING CAST COTTON 3X4 STRL (CAST SUPPLIES)
SPLINT PLASTER CAST XFAST 3X15 (CAST SUPPLIES) IMPLANT
SPLINT PLASTER XTRA FASTSET 3X (CAST SUPPLIES) ×10
STOCKINETTE 4X48 STRL (DRAPES) ×2 IMPLANT
SUT ETHILON 4 0 PS 2 18 (SUTURE) IMPLANT
SWAB COLLECTION DEVICE MRSA (MISCELLANEOUS) ×1 IMPLANT
SWAB CULTURE ESWAB REG 1ML (MISCELLANEOUS) ×1 IMPLANT
SYR 20ML LL LF (SYRINGE) ×1 IMPLANT
SYR BULB EAR ULCER 3OZ GRN STR (SYRINGE) ×2 IMPLANT
SYR CONTROL 10ML LL (SYRINGE) ×1 IMPLANT
SYR TOOMEY 50ML (SYRINGE) IMPLANT
TOWEL GREEN STERILE FF (TOWEL DISPOSABLE) ×4 IMPLANT
TUBE FEEDING ENTERAL 5FR 16IN (TUBING) IMPLANT
UNDERPAD 30X36 HEAVY ABSORB (UNDERPADS AND DIAPERS) ×2 IMPLANT

## 2021-12-10 NOTE — Anesthesia Preprocedure Evaluation (Addendum)
Anesthesia Evaluation  Patient identified by MRN, date of birth, ID band Patient awake    Reviewed: Allergy & Precautions, NPO status , Patient's Chart, lab work & pertinent test results  Airway Mallampati: II  TM Distance: >3 FB Neck ROM: Full    Dental no notable dental hx. (+) Teeth Intact, Dental Advisory Given   Pulmonary neg pulmonary ROS,    Pulmonary exam normal breath sounds clear to auscultation       Cardiovascular Exercise Tolerance: Good negative cardio ROS Normal cardiovascular exam Rhythm:Regular Rate:Normal     Neuro/Psych negative neurological ROS  negative psych ROS   GI/Hepatic negative GI ROS, Neg liver ROS,   Endo/Other  negative endocrine ROS  Renal/GU negative Renal ROS  negative genitourinary   Musculoskeletal negative musculoskeletal ROS (+)   Abdominal   Peds  Hematology negative hematology ROS (+)   Anesthesia Other Findings   Reproductive/Obstetrics                             Anesthesia Physical Anesthesia Plan  ASA: 1  Anesthesia Plan: General   Post-op Pain Management: Dilaudid IV   Induction: Intravenous  PONV Risk Score and Plan: 1 and Amisulpride, Ondansetron and Midazolam  Airway Management Planned: LMA  Additional Equipment: None  Intra-op Plan:   Post-operative Plan:   Informed Consent: I have reviewed the patients History and Physical, chart, labs and discussed the procedure including the risks, benefits and alternatives for the proposed anesthesia with the patient or authorized representative who has indicated his/her understanding and acceptance.     Dental advisory given  Plan Discussed with: CRNA and Anesthesiologist  Anesthesia Plan Comments:        Anesthesia Quick Evaluation
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
## 2021-12-10 NOTE — Anesthesia Postprocedure Evaluation (Signed)
Anesthesia Post Note  Patient: Neill Mcfarland  Procedure(s) Performed: LEFT LONG FINGER INCISION AND DRAINAGE METACARPAL PHALANGEAL JOINT (Left: Hand)     Patient location during evaluation: PACU Anesthesia Type: General Level of consciousness: awake and alert Pain management: pain level controlled Vital Signs Assessment: post-procedure vital signs reviewed and stable Respiratory status: spontaneous breathing, nonlabored ventilation, respiratory function stable and patient connected to nasal cannula oxygen Cardiovascular status: blood pressure returned to baseline and stable Postop Assessment: no apparent nausea or vomiting Anesthetic complications: no   No notable events documented.  Last Vitals:  Vitals:   12/10/21 1700 12/10/21 1715  BP: (!) 145/105 (!) 133/98  Pulse: 70 64  Resp: 11 16  Temp:  36.4 C  SpO2: 96% 96%    Last Pain:  Vitals:   12/10/21 1715  TempSrc:   PainSc: 4                  Trevor Iha
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
## 2021-12-10 NOTE — Discharge Instructions (Addendum)

## 2021-12-10 NOTE — Transfer of Care (Signed)
Immediate Anesthesia Transfer of Care Note  Patient: Lance Collins  Procedure(s) Performed: LEFT LONG FINGER INCISION AND DRAINAGE METACARPAL PHALANGEAL JOINT (Left: Hand)  Patient Location: PACU  Anesthesia Type:General  Level of Consciousness: drowsy and patient cooperative  Airway & Oxygen Therapy: Patient Spontanous Breathing and Patient connected to face mask oxygen  Post-op Assessment: Report given to RN and Post -op Vital signs reviewed and stable  Post vital signs: Reviewed and stable  Last Vitals:  Vitals Value Taken Time  BP    Temp    Pulse 66 12/10/21 1605  Resp 12 12/10/21 1605  SpO2 100 % 12/10/21 1605  Vitals shown include unvalidated device data.  Last Pain:  Vitals:   12/10/21 1340  TempSrc: Oral  PainSc: 0-No pain      Patients Stated Pain Goal: 3 (12/10/21 1340)  Complications: No notable events documented.

## 2021-12-10 NOTE — Anesthesia Procedure Notes (Signed)
Procedure Name: LMA Insertion Date/Time: 12/10/2021 3:31 PM Performed by: Thornell Mule, CRNA Pre-anesthesia Checklist: Patient identified, Emergency Drugs available, Suction available and Patient being monitored Patient Re-evaluated:Patient Re-evaluated prior to induction Oxygen Delivery Method: Circle system utilized Preoxygenation: Pre-oxygenation with 100% oxygen Induction Type: IV induction LMA: LMA inserted LMA Size: 4.0 Number of attempts: 1 Placement Confirmation: positive ETCO2 Tube secured with: Tape Dental Injury: Teeth and Oropharynx as per pre-operative assessment

## 2021-12-10 NOTE — Op Note (Signed)
I assisted Surgeon(s) and Role:    Betha Loa, MD - Primary on the Procedure(s): LEFT LONG FINGER INCISION AND DRAINAGE METACARPAL PHALANGEAL JOINT on 12/10/2021.  I provided assistance on this case as follows: Set up, approach, retraction, application dressing and splint  Electronically signed by: Cindee Salt, MD Date: 12/10/2021 Time: 3:59 PM

## 2021-12-11 ENCOUNTER — Encounter (HOSPITAL_BASED_OUTPATIENT_CLINIC_OR_DEPARTMENT_OTHER): Payer: Self-pay | Admitting: Orthopedic Surgery

## 2021-12-15 LAB — AEROBIC/ANAEROBIC CULTURE W GRAM STAIN (SURGICAL/DEEP WOUND): Culture: NO GROWTH

## 2024-01-01 NOTE — Progress Notes (Signed)
 Cardiology Office Note:    Date:  01/04/2024   ID:  Lance Collins, DOB 19-Oct-1981, MRN [id number]  PCP:  [facility]   [facility] Providers Cardiologist:  None     Referring MD: Lenon Lance SAILOR, FNP   Chief Complaint  Patient presents with   Loss of Consciousness   Hyperlipidemia    History of Present Illness:    Lance Collins is a 43 y.o. male is seen at the request of Lance Lenon FNP for evaluation of hyperlipidemia. He reports a family history of heart disease  with his great grandfather and an uncle. He denies any chest pain or SOB. He does report a couple of syncopal episodes - once during a bad coughing spell and another when he awoke suddenly with N/V. He admits to eating poorly - eating lo mein almost everyday. Also drinks 5-6 beers a day.   Past Medical History:  Diagnosis Date   Fracture    right ankle   Hypercholesterolemia    Inguinal hernia     Past Surgical History:  Procedure Laterality Date   ANTERIOR CRUCIATE LIGAMENT REPAIR Right 10/02/2020   Procedure: right knee anterior cruciate ligament reconstruction quad autograft, meniscal root repair, medial collateral ligament repair;  Surgeon: Addie Cordella Hamilton, MD;  Location[facility];  Service: Orthopedics;  Laterality: Right;   INCISION AND DRAINAGE Left 12/10/2021   Procedure: LEFT LONG FINGER INCISION AND DRAINAGE METACARPAL PHALANGEAL JOINT;  Surgeon: Murrell Drivers, MD;  Location: [facility];  Service: Orthopedics;  Laterality: Left;   MULTIPLE TOOTH EXTRACTIONS     ORIF ANKLE FRACTURE Right 05/11/2018   Procedure: OPEN REDUCTION INTERNAL FIXATION (ORIF) ANKLE FRACTURE AND SYNDESMOSIS INJURY;  Surgeon: Celena Sharper, MD;  Location: [facility];  Service: Orthopedics;  Laterality: Right;    Current Medications: Current Meds  Medication Sig   atorvastatin (LIPITOR) 20 MG tablet Take 20 mg by mouth daily.   cetirizine (ZYRTEC) 10 MG tablet Take 10 mg by mouth daily.    omeprazole (PRILOSEC) 40 MG capsule Take 40 mg by mouth daily.   pantoprazole (PROTONIX) 20 MG tablet Take 20 mg by mouth daily.     Allergies:   Strawberry extract   Social History   Socioeconomic History   Marital status: Single    Spouse name: Not on file   Number of children: 4   Years of education: Not on file   Highest education level: Not on file  Occupational History   Not on file  Tobacco Use   Smoking status: Never   Smokeless tobacco: Never  Vaping Use   Vaping status: Never Used  Substance and Sexual Activity   Alcohol use: Yes    Alcohol/week: 5.0 standard drinks of alcohol    Types: 5 Cans of beer per week    Comment: daily   Drug use: Yes    Types: Marijuana   Sexual activity: Not on file  Other Topics Concern   Not on file  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  ROS:   Please see the history of present illness.  All other systems reviewed and are negative.  EKGs/Labs/Other Studies Reviewed:    The following studies were reviewed today: EKG Interpretation Date/Time:  Wednesday January 04 2024 09:39:57 EST Ventricular Rate:  76 PR Interval:  154 QRS Duration:  88 QT Interval:  368 QTC Calculation: 414 R Axis:   36  Text Interpretation: Normal sinus rhythm with sinus arrhythmia Normal ECG No previous ECGs available Confirmed by Latravis Grine [phone]) on 01/04/2024 9:56:05 AM   EKG Interpretation Date/Time:  Wednesday January 04 2024 09:39:57 EST Ventricular Rate:  76 PR Interval:  154 QRS Duration:  88 QT Interval:  368 QTC Calculation: 414 R Axis:   36  Text  Interpretation: Normal sinus rhythm with sinus arrhythmia Normal ECG No previous ECGs available Confirmed by Ralphine Hinks [phone]) on 01/04/2024 9:56:05 AM    Recent Labs: No results found for requested labs within last 365 days.  Recent Lipid Panel No results found for: CHOL, TRIG, HDL, CHOLHDL, VLDL, LDLCALC, LDLDIRECT  Lipid panel in Dec showed cholesterol 131, triglycerides 277, HDL 38 and LDL 43.  Risk Assessment/Calculations:                Physical Exam:    VS:  BP 120/78   Pulse 76   Ht 5' 7 (1.702 m)   Wt 191 lb 3.2 oz (86.7 kg)   SpO2 97%   BMI 29.95 kg/m     Wt Readings from Last 3 Encounters:  01/04/24 191 lb 3.2 oz (86.7 kg)  12/10/21 178 lb 2.1 oz (80.8 kg)  11/26/21 178 lb (80.7 kg)     GEN:  Well nourished, well developed in no acute distress HEENT: Normal NECK: No JVD; No carotid bruits LYMPHATICS: No lymphadenopathy CARDIAC: RRR, no murmurs, rubs, gallops RESPIRATORY:  Clear to auscultation without rales, wheezing or rhonchi  ABDOMEN: Soft, non-tender, non-distended MUSCULOSKELETAL:  No edema; No deformity  SKIN: Warm and dry NEUROLOGIC:  Alert and oriented x 3 PSYCHIATRIC:  Normal affect   ASSESSMENT:    1. Hypercholesterolemia   2. Vasovagal syncope    PLAN:    In order of problems listed above:  Hyperlipidemia- LDL control excellent on statin therapy. Triglycerides are still elevated which is related to poor diet and high beer intake. Discussed eating a heart healthy diet with lots of fruits and vegetables and eliminating Etoh use. This should help his triglycerides. Regular aerobic activity. Discussed possibility of getting a coronary calcium score to look at CV risk- he is going to think about it.  Vasovagal syncope. Both episodes he had with strong vagal stimulus. Would monitor for now. Normal cardiac exam and Ecg.        Follow up PRN    Medication Adjustments/Labs and Tests Ordered: Current medicines are reviewed at  length with the patient today.  Concerns regarding medicines are outlined above.  Orders Placed This Encounter  Procedures   EKG 12-Lead   No orders of the defined types were placed in this encounter.   There are no Patient Instructions on file for this visit.   Signed, Keisi Eckford, MD  01/04/2024 10:16 AM    [facility]

## 2024-01-04 ENCOUNTER — Encounter: Payer: Self-pay | Admitting: Cardiology

## 2024-01-04 ENCOUNTER — Ambulatory Visit: Payer: Medicaid Other | Attending: Cardiology | Admitting: Cardiology

## 2024-01-04 DIAGNOSIS — R55 Syncope and collapse: Secondary | ICD-10-CM | POA: Diagnosis not present

## 2024-01-04 DIAGNOSIS — E7800 Pure hypercholesterolemia, unspecified: Secondary | ICD-10-CM

## 2024-01-04 NOTE — Patient Instructions (Signed)
 Medication Instructions:  Continue same medications   Lab Work: None ordered   Testing/Procedures: None ordered   Follow-Up: At [facility], you and your health needs are our priority.  As part of our continuing mission to provide you with exceptional heart care, we have created designated Provider Care Teams.  These Care Teams include your primary Cardiologist (physician) and Advanced Practice Providers (APPs -  Physician Assistants and Nurse Practitioners) who all work together to provide you with the care you need, when you need it.  We recommend signing up for the patient portal called MyChart.  Sign up information is provided on this After Visit Summary.  MyChart is used to connect with patients for Virtual Visits (Telemedicine).  Patients are able to view lab/test results, encounter notes, upcoming appointments, etc.  Non-urgent messages can be sent to your provider as well.   To learn more about what you can do with MyChart, go to [email or web address].    Your next appointment:  As Needed    Provider:  Dr.Jordan        Eat Heart Healthy Diet

## 2025-01-01 ENCOUNTER — Encounter (HOSPITAL_BASED_OUTPATIENT_CLINIC_OR_DEPARTMENT_OTHER): Payer: Self-pay | Admitting: Pulmonary Disease

## 2025-01-01 DIAGNOSIS — R0681 Apnea, not elsewhere classified: Secondary | ICD-10-CM

## 2025-01-01 DIAGNOSIS — G4710 Hypersomnia, unspecified: Secondary | ICD-10-CM

## 2025-01-01 DIAGNOSIS — R0683 Snoring: Secondary | ICD-10-CM
